# Patient Record
Sex: Female | Born: 1960
Health system: Southern US, Community
[De-identification: ages and names within clinical notes are randomized; demographics above are authoritative.]

## PROBLEM LIST (undated history)

## (undated) DIAGNOSIS — I340 Nonrheumatic mitral (valve) insufficiency: Secondary | ICD-10-CM

## (undated) DIAGNOSIS — O1200 Gestational edema, unspecified trimester: Secondary | ICD-10-CM

## (undated) DIAGNOSIS — J3089 Other allergic rhinitis: Secondary | ICD-10-CM

## (undated) DIAGNOSIS — F410 Panic disorder [episodic paroxysmal anxiety] without agoraphobia: Secondary | ICD-10-CM

## (undated) DIAGNOSIS — O139 Gestational [pregnancy-induced] hypertension without significant proteinuria, unspecified trimester: Secondary | ICD-10-CM

## (undated) DIAGNOSIS — Z801 Family history of malignant neoplasm of trachea, bronchus and lung: Secondary | ICD-10-CM

## (undated) DIAGNOSIS — E785 Hyperlipidemia, unspecified: Secondary | ICD-10-CM

## (undated) DIAGNOSIS — R002 Palpitations: Secondary | ICD-10-CM

## (undated) DIAGNOSIS — J358 Other chronic diseases of tonsils and adenoids: Secondary | ICD-10-CM

## (undated) DIAGNOSIS — E236 Other disorders of pituitary gland: Secondary | ICD-10-CM

## (undated) DIAGNOSIS — J45909 Unspecified asthma, uncomplicated: Secondary | ICD-10-CM

## (undated) DIAGNOSIS — F329 Major depressive disorder, single episode, unspecified: Secondary | ICD-10-CM

## (undated) DIAGNOSIS — I10 Essential (primary) hypertension: Secondary | ICD-10-CM

## (undated) DIAGNOSIS — J309 Allergic rhinitis, unspecified: Secondary | ICD-10-CM

## (undated) DIAGNOSIS — Z8249 Family history of ischemic heart disease and other diseases of the circulatory system: Secondary | ICD-10-CM

## (undated) DIAGNOSIS — Z808 Family history of malignant neoplasm of other organs or systems: Secondary | ICD-10-CM

## (undated) DIAGNOSIS — Z8042 Family history of malignant neoplasm of prostate: Secondary | ICD-10-CM

## (undated) DIAGNOSIS — J45990 Exercise induced bronchospasm: Secondary | ICD-10-CM

## (undated) DIAGNOSIS — Z803 Family history of malignant neoplasm of breast: Secondary | ICD-10-CM

## (undated) DIAGNOSIS — F32A Depression, unspecified: Secondary | ICD-10-CM

## (undated) DIAGNOSIS — F419 Anxiety disorder, unspecified: Secondary | ICD-10-CM

## (undated) DIAGNOSIS — B009 Herpesviral infection, unspecified: Secondary | ICD-10-CM

## (undated) DIAGNOSIS — S83209A Unspecified tear of unspecified meniscus, current injury, unspecified knee, initial encounter: Secondary | ICD-10-CM

## (undated) HISTORY — DX: Unspecified tear of unspecified meniscus, current injury, unspecified knee, initial encounter: S83.209A

## (undated) HISTORY — DX: Palpitations: R00.2

## (undated) HISTORY — DX: Anxiety disorder, unspecified: F41.9

## (undated) HISTORY — DX: Other allergic rhinitis: J30.89

## (undated) HISTORY — DX: Nonrheumatic mitral (valve) insufficiency: I34.0

## (undated) HISTORY — DX: Family history of malignant neoplasm of prostate: Z80.42

## (undated) HISTORY — DX: Family history of malignant neoplasm of trachea, bronchus and lung: Z80.1

## (undated) HISTORY — DX: Panic disorder (episodic paroxysmal anxiety): F41.0

## (undated) HISTORY — DX: Other disorders of pituitary gland: E23.6

## (undated) HISTORY — PX: OTHER SURGICAL HISTORY: SHX169

## (undated) HISTORY — DX: Allergic rhinitis, unspecified: J30.9

## (undated) HISTORY — DX: Depression, unspecified: F32.A

## (undated) HISTORY — DX: Herpesviral infection, unspecified: B00.9

## (undated) HISTORY — DX: Family history of malignant neoplasm of other organs or systems: Z80.8

## (undated) HISTORY — DX: Family history of malignant neoplasm of breast: Z80.3

## (undated) HISTORY — PX: FOOT SURGERY: SHX648

## (undated) HISTORY — DX: Essential (primary) hypertension: I10

## (undated) HISTORY — DX: Hyperlipidemia, unspecified: E78.5

## (undated) HISTORY — DX: Gestational edema, unspecified trimester: O12.00

## (undated) HISTORY — DX: Unspecified asthma, uncomplicated: J45.909

## (undated) HISTORY — DX: Other chronic diseases of tonsils and adenoids: J35.8

## (undated) HISTORY — DX: Exercise induced bronchospasm: J45.990

## (undated) HISTORY — DX: Family history of ischemic heart disease and other diseases of the circulatory system: Z82.49

## (undated) HISTORY — DX: Gestational (pregnancy-induced) hypertension without significant proteinuria, unspecified trimester: O13.9

## (undated) HISTORY — DX: Major depressive disorder, single episode, unspecified: F32.9

---

## 1998-01-28 ENCOUNTER — Other Ambulatory Visit: Admission: RE | Admit: 1998-01-28 | Discharge: 1998-01-28 | Payer: Self-pay | Admitting: Obstetrics and Gynecology

## 1998-08-01 ENCOUNTER — Ambulatory Visit (HOSPITAL_COMMUNITY): Admission: RE | Admit: 1998-08-01 | Discharge: 1998-08-01 | Payer: Self-pay | Admitting: Gynecology

## 1999-03-08 ENCOUNTER — Other Ambulatory Visit: Admission: RE | Admit: 1999-03-08 | Discharge: 1999-03-08 | Payer: Self-pay | Admitting: Obstetrics and Gynecology

## 2000-02-01 ENCOUNTER — Other Ambulatory Visit: Admission: RE | Admit: 2000-02-01 | Discharge: 2000-02-01 | Payer: Self-pay | Admitting: Obstetrics and Gynecology

## 2000-08-21 ENCOUNTER — Other Ambulatory Visit: Admission: RE | Admit: 2000-08-21 | Discharge: 2000-08-21 | Payer: Self-pay | Admitting: Obstetrics and Gynecology

## 2000-08-21 ENCOUNTER — Encounter (INDEPENDENT_AMBULATORY_CARE_PROVIDER_SITE_OTHER): Payer: Self-pay

## 2000-12-07 ENCOUNTER — Inpatient Hospital Stay (HOSPITAL_COMMUNITY): Admission: RE | Admit: 2000-12-07 | Discharge: 2000-12-07 | Payer: Self-pay | Admitting: Obstetrics and Gynecology

## 2001-01-12 ENCOUNTER — Inpatient Hospital Stay: Admission: AD | Admit: 2001-01-12 | Discharge: 2001-01-12 | Payer: Self-pay | Admitting: Obstetrics and Gynecology

## 2001-03-05 ENCOUNTER — Other Ambulatory Visit: Admission: RE | Admit: 2001-03-05 | Discharge: 2001-03-05 | Payer: Self-pay | Admitting: Obstetrics and Gynecology

## 2002-04-27 ENCOUNTER — Other Ambulatory Visit: Admission: RE | Admit: 2002-04-27 | Discharge: 2002-04-27 | Payer: Self-pay | Admitting: Obstetrics and Gynecology

## 2003-05-03 ENCOUNTER — Other Ambulatory Visit: Admission: RE | Admit: 2003-05-03 | Discharge: 2003-05-03 | Payer: Self-pay | Admitting: Obstetrics and Gynecology

## 2004-05-03 ENCOUNTER — Other Ambulatory Visit: Admission: RE | Admit: 2004-05-03 | Discharge: 2004-05-03 | Payer: Self-pay | Admitting: Obstetrics and Gynecology

## 2005-05-11 ENCOUNTER — Other Ambulatory Visit: Admission: RE | Admit: 2005-05-11 | Discharge: 2005-05-11 | Payer: Self-pay | Admitting: Obstetrics and Gynecology

## 2005-11-05 HISTORY — PX: ABLATION: SHX5711

## 2006-05-20 ENCOUNTER — Other Ambulatory Visit: Admission: RE | Admit: 2006-05-20 | Discharge: 2006-05-20 | Payer: Self-pay | Admitting: Obstetrics & Gynecology

## 2006-06-06 ENCOUNTER — Encounter: Admission: RE | Admit: 2006-06-06 | Discharge: 2006-06-06 | Payer: Self-pay | Admitting: Gastroenterology

## 2006-07-16 ENCOUNTER — Ambulatory Visit (HOSPITAL_BASED_OUTPATIENT_CLINIC_OR_DEPARTMENT_OTHER): Admission: RE | Admit: 2006-07-16 | Discharge: 2006-07-16 | Payer: Self-pay | Admitting: Obstetrics and Gynecology

## 2006-09-06 ENCOUNTER — Ambulatory Visit: Payer: Self-pay | Admitting: Sports Medicine

## 2006-12-10 ENCOUNTER — Ambulatory Visit: Payer: Self-pay | Admitting: Sports Medicine

## 2007-05-30 ENCOUNTER — Other Ambulatory Visit: Admission: RE | Admit: 2007-05-30 | Discharge: 2007-05-30 | Payer: Self-pay | Admitting: Obstetrics and Gynecology

## 2008-07-09 ENCOUNTER — Other Ambulatory Visit: Admission: RE | Admit: 2008-07-09 | Discharge: 2008-07-09 | Payer: Self-pay | Admitting: Obstetrics and Gynecology

## 2009-08-01 ENCOUNTER — Ambulatory Visit: Payer: Self-pay | Admitting: Sports Medicine

## 2009-08-01 DIAGNOSIS — R269 Unspecified abnormalities of gait and mobility: Secondary | ICD-10-CM | POA: Insufficient documentation

## 2009-08-01 DIAGNOSIS — M7750 Other enthesopathy of unspecified foot: Secondary | ICD-10-CM | POA: Insufficient documentation

## 2010-12-07 NOTE — Assessment & Plan Note (Signed)
Summary: orthotics/kh   Vital Signs:  Patient profile:   50 year old female Height:      67 inches Weight:      200 pounds BMI:     31.44 BP sitting:   124 / 70  Vitals Entered By: Lillia Pauls CMA (August 01, 2009 11:18 AM)  History of Present Illness: 66F presents for a new set of orthotics.  Had a set previously, had some MT padding in the past which was not present on the current set, also has some 1st MT posting.  Has tried exerciuse classes and walking but has persistent forefoot pain if not using orthotics  wanting to exercise for weight loss  Physical Exam  General:  Well-developed,well-nourished,in no acute distress; alert,appropriate and cooperative throughout examination Msk:  Ankle: No visible erythema or swelling. Range of motion is full in all directions. Strength is 5/5 in all directions. Stable lateral and medial ligaments; squeeze test and kleiger test unremarkable; Talar dome nontender; No pain at base of 5th MT; No tenderness over cuboid; No tenderness over N spot or navicular prominence No tenderness on posterior aspects of lateral and medial malleolus No sign of peroneal tendon subluxations; Negative tarsal tunnel tinel's mild collapse of transverse arch. bilaterally She does pronate significantly with gait.  Additional Exam:  Patient was fitted for a : standard, cushioned, semi-rigid orthotic. The orthotic was heated and afterward the patient stood on the orthotic blank positioned on the orthotic stand. The patient was positioned in subtalar neutral position and 10 degrees of ankle dorsiflexion in a weight bearing stance. After completion of molding, a stable base was applied to the orthotic blank. The blank was ground to a stable position for weight bearing. Size: Base:black dense foam Posting: none Additional orthotic padding: small MT pad on left, MT cookie on right.  Posting not repeated.  Pt with significant improvement in symptoms with new  orthotic.    Impression & Recommendations:  Problem # 1:  ABNORMALITY OF GAIT (ICD-781.2) Assessment Improved  Semi-rigid orthotics made today, gait improved.  Orders: [employer], each unit [phone])  Problem # 2:  METATARSALGIA (ICD-726.70) Assessment: Improved  MT pad small on left, MT cookie on right.  Orders: [employer], each unit [phone])

## 2011-03-23 NOTE — Op Note (Signed)
Vickie Perry, Vickie Perry           ACCOUNT NO.:  [id number]   MEDICAL RECORD NO.:  [id number]          PATIENT TYPE:  AMB   LOCATION:  [facility]                         FACILITY:  [facility]   PHYSICIAN:  Cynthia P. Romine, M.D.DATE OF BIRTH:  1961-03-23   DATE OF PROCEDURE:  07/16/2006  DATE OF DISCHARGE:                                 OPERATIVE REPORT   PREOPERATIVE DIAGNOSIS:  Menorrhagia.   POSTOPERATIVE DIAGNOSIS:  Menorrhagia.   PROCEDURE:  Endometrial ablation using the HydroThermAblator technique.   SURGEON:  Dr. Arline Asp Romine.   ANESTHESIA:  General by LMA.   ESTIMATED BLOOD LOSS:  Minimal.   COMPLICATIONS:  None.   PROCEDURE:  The patient was taken to the operating room and, after induction  of adequate general anesthesia by LMA, was placed in dorsal lithotomy  position.  The bladder was drained with a red rubber catheter, and she was  prepped and draped in usual fashion.  A posterior weighted and anterior Sims  retractor were placed.  The cervix was grasped on its anterior lip with a  single-tooth tenaculum.  The cervix sounded to 8 cm.  The cervix was dilated  to a #23 Shawnie Pons.  The hysteroscope was introduced.  Proper placement in the  endometrial cavity was documented by noting the tubal ostia.  Photographic  documentation was taken.  The scope was withdrawn to just inside the  internal os and endometrial ablation was carried out according to  manufacturer's direction.  During the warm-up phase of the procedure, the  fluid level to drop 10 mL.  The uterus was inspected and no evidence of  perforation could be found.  There was no leak from the cervix that could be  found, and the procedure continued.  During the ablation, the fluid level  dropped again, not quite 10 mL so that the machine did not automatically cut  off, but during the phase of treatment, it was noted that there was a loose  connection in the tubing in a small trickle of fluid was trickling down the  tubing.   This was felt to explain the drop in the fluid level and no fluid  loss in this patient was noted.  The uterus was intact.  The fit of the  cervix around the hysteroscope was tight.  No fluid was noted in the vagina.  Upon completion of 10 minutes of treatment, cool fluid was run through the  uterus.  Photographic documentation was taken of the uterus after the  ablation.  When the fluid had completed its cooling, the scope was  withdrawn.  Photographic documentation was taken of the internal os.  Scope was  withdrawn.  The instruments were removed from the vagina and the procedure  was terminated.  The patient tolerated it well and went in satisfactory  condition to post anesthesia recovery.           ______________________________  Edwena Felty. Romine, M.D.     CPR/MEDQ  D:  07/16/2006  T:  07/16/2006  Job:  [id number]   cc:   Aram Beecham P. Romine, M.D.  Fax: [phone]

## 2011-10-21 ENCOUNTER — Ambulatory Visit (INDEPENDENT_AMBULATORY_CARE_PROVIDER_SITE_OTHER): Payer: 59

## 2011-10-21 DIAGNOSIS — J029 Acute pharyngitis, unspecified: Secondary | ICD-10-CM

## 2011-10-21 DIAGNOSIS — IMO0001 Reserved for inherently not codable concepts without codable children: Secondary | ICD-10-CM

## 2011-10-21 DIAGNOSIS — J111 Influenza due to unidentified influenza virus with other respiratory manifestations: Secondary | ICD-10-CM

## 2012-05-05 DIAGNOSIS — J358 Other chronic diseases of tonsils and adenoids: Secondary | ICD-10-CM

## 2012-05-05 HISTORY — DX: Other chronic diseases of tonsils and adenoids: J35.8

## 2012-07-04 ENCOUNTER — Encounter: Payer: Self-pay | Admitting: Sports Medicine

## 2012-07-29 ENCOUNTER — Ambulatory Visit (INDEPENDENT_AMBULATORY_CARE_PROVIDER_SITE_OTHER): Payer: 59 | Admitting: Sports Medicine

## 2012-07-29 VITALS — BP 110/70 | Ht 67.0 in | Wt 185.0 lb

## 2012-07-29 DIAGNOSIS — R269 Unspecified abnormalities of gait and mobility: Secondary | ICD-10-CM

## 2012-07-29 DIAGNOSIS — M7750 Other enthesopathy of unspecified foot: Secondary | ICD-10-CM

## 2012-07-29 NOTE — Assessment & Plan Note (Signed)
Without orthotics has excess supination and over pronates on compensation giving a lot of ankle "wobble"  Patient was fitted for a : standard, cushioned, semi-rigid orthotic. The orthotic was heated and afterward the patient stood on the orthotic blank positioned on the orthotic stand. The patient was positioned in subtalar neutral position and 10 degrees of ankle dorsiflexion in a weight bearing stance. After completion of molding, a stable base was applied to the orthotic blank. The blank was ground to a stable position for weight bearing. Size: 8 red EVA Base: blue EVA Posting: MT pads Additional orthotic padding: O  Two pairs created for running shoes at beach and home Uses sports insoles in some walking shoes  Gait is much improved and comfortable in these  Time 50 mins

## 2012-07-29 NOTE — Assessment & Plan Note (Signed)
This is clearly helped in past by MT pads  Now pads are not in correct location and old orthotics are broken down p 3+ yrs  New MT measurements taken  New MT pads added to orthotics and other shoes

## 2012-07-29 NOTE — Progress Notes (Signed)
  Subjective:    Patient ID: Vickie Perry, female    DOB: [birth year], 51 y.o.   MRN: [id number]  HPI  Pt presents to clinic for new orthotics. She has had her current pair since 2010. She is jogging now.  Orthotics have corrected her gait issues Much less forefoot pain now Able to run several times per week Distal 2nd toe is painful   Review of Systems     Objective:   Physical Exam Pleasant, NAD Overweight  Rt 2nd toe pain- tip Morton's foot 2nd toe 1 cm longer than 1st, 4th toe 1 cm longer than 3rd  Cavus foot bilat with slight loss long arch 1st MTP hypertrophy bilat No calcaneal valgus Good post tib function Flattening of transverse arch R>L  Mild morton's callus on rt, and under 1st toe   Surgical scar rt 1st MTP      Assessment & Plan:

## 2013-05-20 ENCOUNTER — Telehealth: Payer: Self-pay | Admitting: Nurse Practitioner

## 2013-05-20 NOTE — Telephone Encounter (Signed)
Patient calling to state has had no cycle since 01/04/2013. Patient states has notice a  Change in her mood behavior. Patient states she has not had a cycle for April, June or July. Please advise.' Last AEX 12/30/2012 with Shirlyn Goltz. In paper chart. Chart in your office.  Please advise.

## 2013-05-20 NOTE — Telephone Encounter (Signed)
No menses since 01/04/13, patient thinks she may need to try another provera challenge.

## 2013-05-21 ENCOUNTER — Other Ambulatory Visit: Payer: Self-pay | Admitting: Orthopedic Surgery

## 2013-05-21 MED ORDER — MEDROXYPROGESTERONE ACETATE 10 MG PO TABS: 10.0000 mg | ORAL_TABLET | Freq: Every day | ORAL | Status: DC

## 2013-05-21 NOTE — Telephone Encounter (Signed)
Spoke with pt about PG advice to try another Provera challenge for 10 days. Pt agreeable. Uses [facility] on [address]. Pt to call back for update.

## 2013-05-21 NOTE — Telephone Encounter (Signed)
Since no menses since 3/214;  I would recommend a Provera challenge 10 mg for 10 days then expect a withdrawal bleed in the 2 wk's following.  Her last /FSH was 4.7 on 12/30/12.  Have her to call back with response to med's or if prolonged heavy bleeding

## 2013-07-20 ENCOUNTER — Telehealth: Payer: Self-pay | Admitting: Nurse Practitioner

## 2013-07-20 NOTE — Telephone Encounter (Signed)
Patient was told to call if she has not started her cycle. 

## 2013-07-20 NOTE — Telephone Encounter (Signed)
Spoke with pt who took Provera July 19th through 28th. Pt had no bleeding or spotting. Was told to call. Please advise.

## 2013-07-21 NOTE — Telephone Encounter (Signed)
Will have patient to keep menses record and in 3 months if no menses again to call back per Dr. Hyacinth Meeker

## 2013-07-23 NOTE — Telephone Encounter (Signed)
Spoke with patient and message from [place] given. Will f/u prn.

## 2013-07-28 ENCOUNTER — Other Ambulatory Visit: Payer: Self-pay | Admitting: Nurse Practitioner

## 2013-07-28 DIAGNOSIS — A609 Anogenital herpesviral infection, unspecified: Secondary | ICD-10-CM

## 2013-07-28 MED ORDER — VALACYCLOVIR HCL 500 MG PO TABS: 500.0000 mg | ORAL_TABLET | Freq: Two times a day (BID) | ORAL | Status: DC

## 2013-07-28 NOTE — Telephone Encounter (Signed)
Order is placed in Epic it was not listed as routine med at last visit. # 30 given with 5 refills

## 2013-07-28 NOTE — Telephone Encounter (Signed)
Please advise AEX scheduled for 01/28/14, patient's last AEX was 12/30/12 no rx was given   (Chart in your door)

## 2013-07-28 NOTE — Telephone Encounter (Signed)
Valtrex #30/5 rf's sent to [facility].

## 2013-09-10 ENCOUNTER — Encounter: Payer: Self-pay | Admitting: Cardiology

## 2013-10-26 ENCOUNTER — Encounter: Payer: Self-pay | Admitting: Nurse Practitioner

## 2013-11-12 ENCOUNTER — Encounter: Payer: Self-pay | Admitting: General Surgery

## 2013-11-12 DIAGNOSIS — E669 Obesity, unspecified: Secondary | ICD-10-CM | POA: Insufficient documentation

## 2013-11-12 DIAGNOSIS — I10 Essential (primary) hypertension: Secondary | ICD-10-CM | POA: Insufficient documentation

## 2013-11-12 DIAGNOSIS — I119 Hypertensive heart disease without heart failure: Secondary | ICD-10-CM

## 2013-11-12 DIAGNOSIS — Q670 Congenital facial asymmetry: Secondary | ICD-10-CM

## 2013-11-12 DIAGNOSIS — R002 Palpitations: Secondary | ICD-10-CM | POA: Insufficient documentation

## 2013-11-12 DIAGNOSIS — I340 Nonrheumatic mitral (valve) insufficiency: Secondary | ICD-10-CM | POA: Insufficient documentation

## 2013-11-26 ENCOUNTER — Ambulatory Visit: Payer: 59 | Admitting: Cardiology

## 2013-12-03 ENCOUNTER — Ambulatory Visit (INDEPENDENT_AMBULATORY_CARE_PROVIDER_SITE_OTHER): Payer: 59 | Admitting: Cardiology

## 2013-12-03 ENCOUNTER — Encounter: Payer: Self-pay | Admitting: General Surgery

## 2013-12-03 ENCOUNTER — Encounter: Payer: Self-pay | Admitting: Cardiology

## 2013-12-03 VITALS — BP 110/80 | HR 72 | Ht 67.0 in | Wt 198.4 lb

## 2013-12-03 DIAGNOSIS — I059 Rheumatic mitral valve disease, unspecified: Secondary | ICD-10-CM

## 2013-12-03 DIAGNOSIS — I340 Nonrheumatic mitral (valve) insufficiency: Secondary | ICD-10-CM

## 2013-12-03 DIAGNOSIS — R002 Palpitations: Secondary | ICD-10-CM

## 2013-12-03 DIAGNOSIS — I10 Essential (primary) hypertension: Secondary | ICD-10-CM

## 2013-12-03 NOTE — Progress Notes (Signed)
[place], [address] [place], [place]  [address] Phone: [phone] Fax:  [phone]  Date:  12/03/2013   ID:  [name], DOB December 30, 1960, MRN [id number]  PCP:  Henrine Screws, MD  Cardiologist:  Fransico Him, MD     History of Present Illness: Vickie Perry is a 53 y.o. female with a history of mild MR/AR/PR, HTN and history of palpitations with no significant arrhythmias on heart monitor who presents today.  She is doing well.  She denies any chest pain, SOB, DOE, LE edema, dizziness or syncope.  She recently went on Phentiramine for weight loss.  SHe rarely has any palpitations and has only had to take PRN metoprolol a few times.   Wt Readings from Last 3 Encounters:  12/03/13 198 lb 6.4 oz (89.994 kg)  07/29/12 185 lb (83.915 kg)  08/01/09 200 lb (90.719 kg)     Past Medical History  Diagnosis Date  . Hypertension   . Mitral regurgitation   . Palpitations   . Dyslipidemia   . Anxiety   . Allergic rhinitis   . Asthma   . Perennial allergic rhinitis   . Exercise induced bronchospasm   . Tonsillar calculus 05/2012    Dr Orpah Greek bates    Current Outpatient Prescriptions  Medication Sig Dispense Refill  . albuterol (PROVENTIL HFA;VENTOLIN HFA) 108 (90 BASE) MCG/ACT inhaler Inhale 2 puffs into the lungs every 6 (six) hours as needed for wheezing or shortness of breath.      . ALPRAZolam (XANAX) 0.25 MG tablet Take 0.25 mg by mouth at bedtime as needed for anxiety (Used rarely).      Marland Kitchen aspirin 81 MG tablet Take 162 mg by mouth daily.       . clonazePAM (KLONOPIN) 1 MG tablet Take 1 mg by mouth daily.      Marland Kitchen escitalopram (LEXAPRO) 10 MG tablet Take 10 mg by mouth daily.      Marland Kitchen levocetirizine (XYZAL) 5 MG tablet Take 5 mg by mouth as needed.       . metoprolol succinate (TOPROL-XL) 25 MG 24 hr tablet Take 25 mg by mouth as needed (for palpitations).      . pantoprazole (PROTONIX) 40 MG tablet Take 40 mg by mouth as needed.      . phentermine  37.5 MG capsule Take 37.5 mg by mouth every morning.      . rosuvastatin (CRESTOR) 10 MG tablet Take 5 mg by mouth daily.      Marland Kitchen topiramate (TOPAMAX) 25 MG tablet Take 25 mg by mouth daily.       Marland Kitchen triamcinolone cream (KENALOG) 0.1 % Apply 1 application topically as needed.       . valACYclovir (VALTREX) 1000 MG tablet TAKE 1 TABLET BY MOUTH as needed      . valsartan (DIOVAN) 160 MG tablet Take 80 mg by mouth daily.      . Vitamin D, Ergocalciferol, (DRISDOL) 50000 UNITS CAPS capsule Take 50,000 Units by mouth every 7 (seven) days.       No current facility-administered medications for this visit.    Allergies:    Allergies  Allergen Reactions  . Erythromycin     Social History:  The patient  reports that she has never smoked. She does not have any smokeless tobacco history on file. She reports that she drinks about 1.8 ounces of alcohol per week. She reports that she does not use illicit drugs.   Family History:  The  patient's family history includes Heart disease in her brother and father; Prostate cancer in her father.   ROS:  Please see the history of present illness.      All other systems reviewed and negative.   PHYSICAL EXAM: VS:  BP 110/80  Pulse 72  Ht 5\' 7"  (1.702 m)  Wt 198 lb 6.4 oz (89.994 kg)  BMI 31.07 kg/m2 Well nourished, well developed, in no acute distress HEENT: normal Neck: no JVD Cardiac:  normal S1, S2; RRR; no murmur Lungs:  clear to auscultation bilaterally, no wheezing, rhonchi or rales Abd: soft, nontender, no hepatomegaly Ext: no edema Skin: warm and dry Neuro:  CNs 2-12 intact, no focal abnormalities noted  EKG:  NSR with no ST changes     ASSESSMENT AND PLAN:  1.  HTN well controlled  - continue Valsartan 2.  Palpitations - theses have significantly decreased in frequency and she has only had to take PRN metoprolol a few times.  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  Mild MR/AR/TR - she is currently taking  phentiramine - she has not had an echo in 5 years   - check 2D echo to make sure she has no valvular effects from the times she has taken phentiramine in the past.     Followup with me in 1 year  Signed, Fransico Him, MD 12/03/2013 10:13 AM

## 2013-12-03 NOTE — Patient Instructions (Signed)
Your physician recommends that you continue on your current medications as directed. Please refer to the Current Medication list given to you today.  Your physician has requested that you have an echocardiogram. Echocardiography is a painless test that uses sound waves to create images of your heart. It provides your doctor with information about the size and shape of your heart and how well your heart's chambers and valves are working. This procedure takes approximately one hour. There are no restrictions for this procedure.  Your physician wants you to follow-up in: 1 year with Dr. Turner.  You will receive a reminder letter in the mail two months in advance. If you don't receive a letter, please call our office to schedule the follow-up appointment.  

## 2013-12-24 ENCOUNTER — Ambulatory Visit (HOSPITAL_COMMUNITY): Payer: 59 | Attending: Internal Medicine | Admitting: Radiology

## 2013-12-24 ENCOUNTER — Other Ambulatory Visit: Payer: Self-pay

## 2013-12-24 ENCOUNTER — Encounter: Payer: Self-pay | Admitting: Internal Medicine

## 2013-12-24 DIAGNOSIS — E785 Hyperlipidemia, unspecified: Secondary | ICD-10-CM | POA: Insufficient documentation

## 2013-12-24 DIAGNOSIS — R002 Palpitations: Secondary | ICD-10-CM

## 2013-12-24 DIAGNOSIS — I340 Nonrheumatic mitral (valve) insufficiency: Secondary | ICD-10-CM

## 2013-12-24 DIAGNOSIS — E669 Obesity, unspecified: Secondary | ICD-10-CM | POA: Insufficient documentation

## 2013-12-24 DIAGNOSIS — I059 Rheumatic mitral valve disease, unspecified: Secondary | ICD-10-CM

## 2013-12-24 DIAGNOSIS — I10 Essential (primary) hypertension: Secondary | ICD-10-CM | POA: Insufficient documentation

## 2013-12-24 NOTE — Progress Notes (Signed)
Echocardiogram performed.  

## 2014-01-28 ENCOUNTER — Ambulatory Visit: Payer: 59 | Admitting: Nurse Practitioner

## 2014-02-02 ENCOUNTER — Ambulatory Visit: Payer: 59 | Admitting: Nurse Practitioner

## 2014-02-25 ENCOUNTER — Ambulatory Visit: Payer: 59 | Admitting: Nurse Practitioner

## 2014-03-02 ENCOUNTER — Encounter: Payer: Self-pay | Admitting: Nurse Practitioner

## 2014-03-02 ENCOUNTER — Ambulatory Visit (INDEPENDENT_AMBULATORY_CARE_PROVIDER_SITE_OTHER): Payer: 59 | Admitting: Nurse Practitioner

## 2014-03-02 VITALS — BP 100/66 | HR 68 | Resp 16 | Ht 66.5 in

## 2014-03-02 DIAGNOSIS — N912 Amenorrhea, unspecified: Secondary | ICD-10-CM

## 2014-03-02 DIAGNOSIS — Z0000 Encounter for general adult medical examination without abnormal findings: Secondary | ICD-10-CM

## 2014-03-02 DIAGNOSIS — N949 Unspecified condition associated with female genital organs and menstrual cycle: Secondary | ICD-10-CM

## 2014-03-02 DIAGNOSIS — Z01419 Encounter for gynecological examination (general) (routine) without abnormal findings: Secondary | ICD-10-CM

## 2014-03-02 DIAGNOSIS — E559 Vitamin D deficiency, unspecified: Secondary | ICD-10-CM

## 2014-03-02 LAB — POCT URINALYSIS DIPSTICK
Bilirubin, UA: NEGATIVE
Blood, UA: NEGATIVE
Glucose, UA: NEGATIVE
Ketones, UA: NEGATIVE
Leukocytes, UA: NEGATIVE
Nitrite, UA: NEGATIVE
Protein, UA: NEGATIVE
Urobilinogen, UA: NEGATIVE
pH, UA: 5

## 2014-03-02 LAB — HEMOGLOBIN, FINGERSTICK: Hemoglobin, fingerstick: 12.7 g/dL (ref 12.0–16.0)

## 2014-03-02 MED ORDER — VALACYCLOVIR HCL 1 G PO TABS: ORAL_TABLET | ORAL | Status: DC

## 2014-03-02 NOTE — Progress Notes (Signed)
Patient ID: Vickie Perry, female   DOB: Oct 24, 1961, 53 y.o.   MRN: [id number] 53 y.o. [id number] Married Caucasian Fe here for annual exam.  Occasional vaso symptom.  Last Provera challenge 10/13 no menses, then normal menses 3 2014 with FSH of 4.7.  Then Provera challenge 05/2013 without menses.  Less moody this year and has done OK without hormones.  She does have a pain right inguinal or at the left mons pubic area for 2 months.  Only tine it hurts is at night when laying on her left side and the right leg lays over that area it will be painful.  She denies any trauma, new exercise class or any positional things that would have caused the pain.   Patient's last menstrual period was 01/04/2013.          Sexually active: yes  The current method of family planning is post menopausal status.    Exercising: yes  Home exercise routine includes walking 4 times per week. Smoker:  no  Health Maintenance: Pap:  12/30/12, WNL, neg HR HPV MMG:  07/10/13, 3D, normal Colonoscopy:  01/2014, normal, repeat in 10 years TDaP:  2007 Labs: HB:  12.7 Urine:  Negative    reports that she has never smoked. She does not have any smokeless tobacco history on file. She reports that she drinks about 1.8 ounces of alcohol per week. She reports that she does not use illicit drugs.  Past Medical History  Diagnosis Date  . Hypertension   . Mitral regurgitation   . Palpitations   . Dyslipidemia   . Anxiety   . Allergic rhinitis   . Asthma   . Perennial allergic rhinitis   . Exercise induced bronchospasm   . Tonsillar calculus 05/2012    Dr Orpah Greek bates    Past Surgical History  Procedure Laterality Date  . Cesarean section      x 2  . Foot surgery    . Plastic eye surgery      Current Outpatient Prescriptions  Medication Sig Dispense Refill  . albuterol (PROVENTIL HFA;VENTOLIN HFA) 108 (90 BASE) MCG/ACT inhaler Inhale 2 puffs into the lungs every 6 (six) hours as needed for wheezing or shortness of  breath.      . ALPRAZolam (XANAX) 0.25 MG tablet Take 0.25 mg by mouth at bedtime as needed for anxiety (Used rarely).      Marland Kitchen aspirin 81 MG tablet Take 162 mg by mouth daily.       . clonazePAM (KLONOPIN) 1 MG tablet Take 1 mg by mouth daily.      Marland Kitchen escitalopram (LEXAPRO) 10 MG tablet Take 10 mg by mouth daily.      . metoprolol succinate (TOPROL-XL) 25 MG 24 hr tablet Take 25 mg by mouth as needed (for palpitations).      . pantoprazole (PROTONIX) 40 MG tablet Take 40 mg by mouth as needed.      . phentermine 37.5 MG capsule Take 37.5 mg by mouth every morning.      . rosuvastatin (CRESTOR) 10 MG tablet Take 5 mg by mouth daily.      Marland Kitchen topiramate (TOPAMAX) 25 MG tablet Take 25 mg by mouth daily.       Marland Kitchen triamcinolone cream (KENALOG) 0.1 % Apply 1 application topically as needed.       . valACYclovir (VALTREX) 1000 MG tablet TAKE 1 TABLET BY MOUTH as needed  30 tablet  12  . valsartan (DIOVAN) 160 MG tablet Take  80 mg by mouth daily.      . Vitamin D, Ergocalciferol, (DRISDOL) 50000 UNITS CAPS capsule Take 50,000 Units by mouth every 7 (seven) days.      Marland Kitchen levocetirizine (XYZAL) 5 MG tablet Take 5 mg by mouth as needed.        No current facility-administered medications for this visit.    Family History  Problem Relation Age of Onset  . Prostate cancer Father   . Heart disease Father   . Heart disease Brother   . Heart disease Mother   . Heart disease Maternal Grandfather   . Cancer Paternal Grandfather     ROS:  Pertinent items are noted in HPI.  Otherwise, a comprehensive ROS was negative.  Exam:   BP 100/66  Pulse 68  Resp 16  Ht 5' 6.5" (1.689 m)  LMP 01/04/2013 Height: 5' 6.5" (168.9 cm)  Ht Readings from Last 3 Encounters:  03/02/14 5' 6.5" (1.689 m)  12/03/13 5\' 7"  (1.702 m)  07/29/12 5\' 7"  (1.702 m)    General appearance: alert, cooperative and appears stated age Head: Normocephalic, without obvious abnormality, atraumatic Neck: no adenopathy, supple,  symmetrical, trachea midline and thyroid normal to inspection and palpation Lungs: clear to auscultation bilaterally Breasts: normal appearance, no masses or tenderness Heart: regular rate and rhythm Abdomen: soft, non-tender; no masses,  no organomegaly Extremities: extremities normal, atraumatic, no cyanosis or edema Skin: Skin color, texture, turgor normal. No rashes or lesions Lymph nodes: Cervical, supraclavicular, and axillary nodes normal. No inguinal nodes, no signs of hernia No abnormal inguinal nodes palpated Neurologic: Grossly normal   Pelvic: External genitalia:  no lesions              Urethra:  normal appearing urethra with no masses, tenderness or lesions              Bartholin's and Skene's: normal                 Vagina: normal appearing vagina with normal color and discharge, no lesions              Cervix: anteverted              Pap taken: no Bimanual Exam:  Uterus:  normal size, contour, position, consistency, mobility, non-tender, not able to illicit same pain.              Adnexa: no mass, fullness, tenderness               Rectovaginal: Confirms               Anus:  normal sphincter tone, no lesions  A:  Well Woman with normal exam  Amenorrhea since 01/2013  Pain right inguinal and mons area X 2 months, doubt hernia as cause  History of Vit  D deficiency  Weight gain  - now back on med's from PCP and had 30 lb weight loss.  P:   Reviewed health and wellness pertinent to exam  Pap smear not taken today  Mammogram due 9/15  Discussed area of pain with Dr. Sabra Heck.  We will do PUS and make sure no GYN related problems.  Will also have a chance to look at endo lining to make sure this is thin as well.    Will follow with [place] and Vit D  Refilled Vit D and follow with labs.  Counseled on breast self exam, mammography screening, menopause, adequate intake of calcium and vitamin D, diet  and exercise, Kegel's exercises return annually or prn  An After Visit Summary  was printed and given to the patient.

## 2014-03-02 NOTE — Patient Instructions (Signed)

## 2014-03-03 LAB — VITAMIN D 25 HYDROXY (VIT D DEFICIENCY, FRACTURES): Vit D, 25-Hydroxy: 39 ng/mL (ref 30–89)

## 2014-03-03 LAB — FOLLICLE STIMULATING HORMONE: FSH: 49.1 m[IU]/mL

## 2014-03-03 NOTE — Addendum Note (Signed)
Addended by: Michele Mcalpine on: 03/03/2014 11:36 AM   Modules accepted: Orders

## 2014-03-04 ENCOUNTER — Telehealth: Payer: Self-pay | Admitting: Obstetrics & Gynecology

## 2014-03-04 NOTE — Telephone Encounter (Signed)
Left message for patient to call back. Need to go over benefits and schedule PUS °

## 2014-03-04 NOTE — Telephone Encounter (Signed)
Spoke with patient. Advised of $25 copay quote received from insurance for PUS. Scheduled PUS. Advised patient of 72 hour cancellation policy and $272 cancellation fee. Patient agreeable.  Mailed the In-Office procedure form that includes appointment date and time, patient copay, and cancellation policy

## 2014-03-04 NOTE — Progress Notes (Signed)
Addendum   Encounter reviewed by Dr. Brook Silva.  

## 2014-03-10 ENCOUNTER — Telehealth: Payer: Self-pay | Admitting: *Deleted

## 2014-03-10 NOTE — Telephone Encounter (Signed)
Message copied by Graylon Good on Wed Mar 10, 2014  8:57 AM ------      Message from: GRUBB, PATRICIA R      Created: Wed Mar 10, 2014  8:46 AM       Let patietn know that VIT D is OK at 50, follow protocol.  Her [place] shows early menopause but if any vaginal bleeding to call.  Also with PUS we will be checking her endo lining. ------

## 2014-03-10 NOTE — Telephone Encounter (Signed)
Pt notified in result note.  

## 2014-03-10 NOTE — Telephone Encounter (Signed)
I have attempted to contact this patient by phone with the following results: left message to return my call on answering machine (home/mobile per DPR).  

## 2014-03-24 ENCOUNTER — Encounter: Payer: Self-pay | Admitting: Gynecology

## 2014-03-25 ENCOUNTER — Ambulatory Visit (INDEPENDENT_AMBULATORY_CARE_PROVIDER_SITE_OTHER): Payer: 59

## 2014-03-25 ENCOUNTER — Encounter: Payer: Self-pay | Admitting: Obstetrics and Gynecology

## 2014-03-25 ENCOUNTER — Ambulatory Visit (INDEPENDENT_AMBULATORY_CARE_PROVIDER_SITE_OTHER): Payer: 59 | Admitting: Obstetrics and Gynecology

## 2014-03-25 VITALS — BP 100/64 | HR 80 | Ht 66.5 in

## 2014-03-25 DIAGNOSIS — R1031 Right lower quadrant pain: Secondary | ICD-10-CM

## 2014-03-25 DIAGNOSIS — N949 Unspecified condition associated with female genital organs and menstrual cycle: Secondary | ICD-10-CM

## 2014-03-25 NOTE — Patient Instructions (Signed)
Call if your pain in the groin persists, increases, or worsens!  We may need to have you see a Education officer, environmental.

## 2014-03-25 NOTE — Progress Notes (Signed)
  Subjective  Patient presents for pelvic ultrasound.  Having right groin pain near mons pubis for 3 months.  No hernia felt on exam.   Moved to a new home 2 weeks ago.  Relief if lays on her back.  No NSAID use.   No dysuria.  Some constipation.  Colonoscopy in March 2015 - told to take Senna more frequently.   History of Cesarean Section and endometrial ablation.   Objective  See ultrasound - normal uterus, ovaries, 3 - 4 mm echogenic focus of the cervix, no free fluid.      Assessment  Right groin pain/mons pain.  Suspicious for potential hernia. Adhesive disease also possible. Normal GYN ultrasound.  Plan  I encourage NSAID use, heat, and observation. If pain persists or increases, consider general surgery evaluation to rule out herniorrhaphy.   15 minutes face to face time of which over 50% was spent in counseling.   After visit summary to patient.

## 2014-06-18 ENCOUNTER — Ambulatory Visit: Payer: 59 | Admitting: Certified Nurse Midwife

## 2014-10-12 ENCOUNTER — Encounter: Payer: Self-pay | Admitting: Cardiology

## 2014-11-09 DIAGNOSIS — IMO0001 Reserved for inherently not codable concepts without codable children: Secondary | ICD-10-CM | POA: Insufficient documentation

## 2014-11-24 ENCOUNTER — Telehealth: Payer: Self-pay | Admitting: Nurse Practitioner

## 2014-11-24 DIAGNOSIS — Z719 Counseling, unspecified: Secondary | ICD-10-CM | POA: Insufficient documentation

## 2014-11-24 NOTE — Telephone Encounter (Signed)
Pt says she is in menopause and would an appointment to talk about medication.

## 2014-11-24 NOTE — Telephone Encounter (Signed)
Spoke with patient. Patient states that she has been having increased mood swings, hot flashes, and night sweats. Patient would like to come in to see Milford Cage, FNP for discuss symptoms. Appointment scheduled for 1/26 at 11:15am. Agreeable to date and time.  Routing to provider for final review. Patient agreeable to disposition. Will close encounter

## 2014-11-30 ENCOUNTER — Encounter: Payer: Self-pay | Admitting: Nurse Practitioner

## 2014-11-30 ENCOUNTER — Ambulatory Visit (INDEPENDENT_AMBULATORY_CARE_PROVIDER_SITE_OTHER): Payer: BLUE CROSS/BLUE SHIELD | Admitting: Nurse Practitioner

## 2014-11-30 VITALS — BP 116/56 | Temp 98.1°F | Resp 16 | Ht 66.5 in

## 2014-11-30 DIAGNOSIS — E559 Vitamin D deficiency, unspecified: Secondary | ICD-10-CM

## 2014-11-30 DIAGNOSIS — R300 Dysuria: Secondary | ICD-10-CM

## 2014-11-30 DIAGNOSIS — N912 Amenorrhea, unspecified: Secondary | ICD-10-CM

## 2014-11-30 LAB — CBC WITH DIFFERENTIAL/PLATELET
Basophils Absolute: 0.1 10*3/uL (ref 0.0–0.1)
Basophils Relative: 1 % (ref 0–1)
Eosinophils Absolute: 0.1 10*3/uL (ref 0.0–0.7)
Eosinophils Relative: 1 % (ref 0–5)
HCT: 40.6 % (ref 36.0–46.0)
Hemoglobin: 13.5 g/dL (ref 12.0–15.0)
Lymphocytes Relative: 14 % (ref 12–46)
Lymphs Abs: 1 10*3/uL (ref 0.7–4.0)
MCH: 29 pg (ref 26.0–34.0)
MCHC: 33.3 g/dL (ref 30.0–36.0)
MCV: 87.1 fL (ref 78.0–100.0)
MPV: 10.8 fL (ref 8.6–12.4)
Monocytes Absolute: 0.4 10*3/uL (ref 0.1–1.0)
Monocytes Relative: 6 % (ref 3–12)
Neutro Abs: 5.4 10*3/uL (ref 1.7–7.7)
Neutrophils Relative %: 78 % — ABNORMAL HIGH (ref 43–77)
Platelets: 347 10*3/uL (ref 150–400)
RBC: 4.66 MIL/uL (ref 3.87–5.11)
RDW: 13.8 % (ref 11.5–15.5)
WBC: 6.9 10*3/uL (ref 4.0–10.5)

## 2014-11-30 LAB — POCT URINALYSIS DIPSTICK
Urobilinogen, UA: NEGATIVE
pH, UA: 5

## 2014-11-30 LAB — PROLACTIN: Prolactin: 5.4 ng/mL

## 2014-11-30 LAB — TSH: TSH: 0.82 u[IU]/mL (ref 0.350–4.500)

## 2014-11-30 LAB — FOLLICLE STIMULATING HORMONE: FSH: 44.9 m[IU]/mL

## 2014-11-30 MED ORDER — FLUCONAZOLE 150 MG PO TABS: 150.0000 mg | ORAL_TABLET | Freq: Once | ORAL | Status: DC

## 2014-11-30 MED ORDER — MEDROXYPROGESTERONE ACETATE 2.5 MG PO TABS: 2.5000 mg | ORAL_TABLET | Freq: Every day | ORAL | Status: DC

## 2014-11-30 MED ORDER — ESTRADIOL 0.5 MG PO TABS: 0.5000 mg | ORAL_TABLET | Freq: Every day | ORAL | Status: DC

## 2014-11-30 NOTE — Progress Notes (Signed)
Subjective:     Patient ID: Vickie Perry, female   DOB: 03-18-1961, 54 y.o.   MRN: [id number]  HPI This 54 yo WM Fe G4, P2 presents with some vaginal irritation of itching after completion of Augmentin for URI.  She then had plastic surgery with a breast lift and liposuction 3 weeks ago.  Took Keflex post op.  On Friday felt like she was quite fatigued and did not feel well.  She has no signs of infection at post op sites and will be seeing surgeon later this week.  Slight dysuria today.  She also presents to discuss menopausal symptoms.  She has increase in night sweats.  Having insomnia, mood changes, decrease libido, vaginal dryness and 'brain fog'.  She would like to  Start on HRT.  No breast cancer in family, no blood dyscrasia.  Father's side of family with heart disease.  LMP 03/17/13 01/2013 Heath 4.7 03/02/14 FSH 49.1  Review of Systems  Constitutional: Positive for fatigue. Negative for fever, chills and appetite change.  HENT: Negative.   Respiratory: Negative.   Cardiovascular: Negative.   Gastrointestinal: Negative.  Negative for nausea, vomiting, abdominal pain and diarrhea.  Genitourinary: Positive for dysuria, vaginal discharge and dyspareunia. Negative for urgency, decreased urine volume, vaginal bleeding, vaginal pain and pelvic pain.  Musculoskeletal: Negative.   Skin: Negative.   Neurological: Negative.   Psychiatric/Behavioral: Negative.        Objective:   Physical Exam  Constitutional: She appears well-developed and well-nourished. No distress.  No other physical exam done at this time.  Cardiovascular: Normal rate and normal heart sounds.   Pulmonary/Chest: Effort normal.  Vitals reviewed.      Assessment:     R/O UTI Yeast vaginitis post antibiotics Menopausal symptoms.     Plan:     Will follow with urine C&S and micro - did not start antibiotics at this time Will give her Diflucan for yeast Discussion about WHI study with potentia risk and  benefits of HRT such as CVA, DVT, cancer, etc She is started on Estrace 0.5 mg daily along with Provera 2.5 mg daily for 3-4  months until AEX Will get labs today of Baudette, Prolactin, TSH, VIt D, CBC She will follow with surgeon this week about the surgery If any symptoms with HRT to call back.

## 2014-11-30 NOTE — Patient Instructions (Signed)

## 2014-12-01 LAB — URINALYSIS, MICROSCOPIC ONLY
Bacteria, UA: NONE SEEN
Casts: NONE SEEN
Crystals: NONE SEEN
Squamous Epithelial / LPF: NONE SEEN

## 2014-12-01 LAB — URINE CULTURE: Colony Count: 30000

## 2014-12-01 LAB — VITAMIN D 25 HYDROXY (VIT D DEFICIENCY, FRACTURES): Vit D, 25-Hydroxy: 23 ng/mL — ABNORMAL LOW (ref 30–100)

## 2014-12-02 ENCOUNTER — Other Ambulatory Visit: Payer: Self-pay | Admitting: Nurse Practitioner

## 2014-12-02 NOTE — Progress Notes (Signed)
Reviewed personally.  M. Suzanne Ranata Laughery, MD.  

## 2014-12-06 ENCOUNTER — Other Ambulatory Visit: Payer: Self-pay | Admitting: Nurse Practitioner

## 2014-12-06 NOTE — Telephone Encounter (Signed)
Medication refill request: Vitamin D 50,000 Last AEX:  03/02/14 Next AEX: 03/22/15 Last MMG (if hormonal medication request): 07/20/14 BIRADS1:Neg Refill authorized: Today #12/1R?

## 2015-01-04 ENCOUNTER — Ambulatory Visit (INDEPENDENT_AMBULATORY_CARE_PROVIDER_SITE_OTHER): Payer: BLUE CROSS/BLUE SHIELD | Admitting: Internal Medicine

## 2015-01-04 VITALS — BP 110/68 | HR 83 | Temp 98.9°F | Ht 66.5 in

## 2015-01-04 DIAGNOSIS — J0100 Acute maxillary sinusitis, unspecified: Secondary | ICD-10-CM

## 2015-01-04 MED ORDER — HYDROCODONE-HOMATROPINE 5-1.5 MG/5ML PO SYRP: 5.0000 mL | ORAL_SOLUTION | Freq: Four times a day (QID) | ORAL | Status: DC | PRN

## 2015-01-04 MED ORDER — AMOXICILLIN 500 MG PO CAPS: 1000.0000 mg | ORAL_CAPSULE | Freq: Two times a day (BID) | ORAL | Status: AC

## 2015-01-04 NOTE — Progress Notes (Signed)
This chart was scribed for Tami Lin, MD by Einar Pheasant, ED Scribe. This patient was seen in room 13 and the patient's care was started at 9:09 PM.  Subjective:    Patient ID: Vickie Perry, female    DOB: 1961/03/03, 54 y.o.   MRN: [id number]  Chief Complaint  Patient presents with  . Sinus Problem  . Cough    HPI Vickie Perry is a 54 y.o. female with personal medical h/o asthma and HTN.  Pt presents to the office today complaining of a sinusitis that started approximately 1 week ago. She is complaining of associated congestion, productive cough, HA, and bilateral ear pain. Pt states that the cough is worse at night. She reports taking Mucinex DM, sudafed, and Claritin with no relief of her cough or congestion. Pt also reports using her albuterol inhaler with not relief. She denies any fever, neck pain, sore throat, visual disturbance, CP, SOB, abdominal pain, nausea, emesis, diarrhea, urinary symptoms, back pain, HA, weakness, numbness and rash as associated symptoms.     Patient Active Problem List   Diagnosis Date Noted  . Palpitations 11/12/2013  . Obesity, unspecified 11/12/2013  . Benign essential HTN 11/12/2013  . Mitral regurgitation 11/12/2013  . Facial asymmetry 11/12/2013  . METATARSALGIA 08/01/2009  . ABNORMALITY OF GAIT 08/01/2009   Past Medical History  Diagnosis Date  . Hypertension   . Mitral regurgitation   . Palpitations   . Dyslipidemia   . Anxiety   . Allergic rhinitis   . Asthma   . Perennial allergic rhinitis   . Exercise induced bronchospasm   . Tonsillar calculus 05/2012    Dr Orpah Greek bates  . Panic attack   . HSV-2 (herpes simplex virus 2) infection   . Depression     2005  . Hypophysitis     H/O Autoimmune  . PIH (pregnancy induced hypertension)     P partum// severe   . Edema in pregnancy    Past Surgical History  Procedure Laterality Date  . Foot surgery    . Plastic eye surgery    . Cesarean section        x 2// Due to HSV2  . Ablation  2007    HTA   Allergies  Allergen Reactions  . Erythromycin Anaphylaxis   Prior to Admission medications   Medication Sig Start Date End Date Taking? Authorizing Provider  albuterol (PROVENTIL HFA;VENTOLIN HFA) 108 (90 BASE) MCG/ACT inhaler Inhale 2 puffs into the lungs every 6 (six) hours as needed for wheezing or shortness of breath.   Yes Historical Provider, MD  aspirin 81 MG tablet Take 162 mg by mouth 2 (two) times daily.    Yes Historical Provider, MD  beclomethasone (QVAR) 40 MCG/ACT inhaler Inhale 2 puffs into the lungs 2 (two) times daily.   Yes Historical Provider, MD  clonazePAM (KLONOPIN) 1 MG tablet Take 1 mg by mouth daily.   Yes Historical Provider, MD  escitalopram (LEXAPRO) 10 MG tablet Take 10 mg by mouth daily.   Yes Historical Provider, MD  estradiol (ESTRACE) 0.5 MG tablet Take 1 tablet (0.5 mg total) by mouth daily. 11/30/14  Yes Patricia Rolen-Grubb, FNP  fluconazole (DIFLUCAN) 150 MG tablet Take 1 tablet (150 mg total) by mouth once. Take one tablet.  Repeat in 48 hours if symptoms are not completely resolved. 11/30/14  Yes Patricia Rolen-Grubb, FNP  medroxyPROGESTERone (PROVERA) 2.5 MG tablet Take 1 tablet (2.5 mg total) by mouth daily. 11/30/14  Yes  Milford Cage, FNP  metoprolol succinate (TOPROL-XL) 25 MG 24 hr tablet Take 25 mg by mouth as needed (for palpitations).   Yes Historical Provider, MD  pantoprazole (PROTONIX) 40 MG tablet Take 40 mg by mouth as needed.   Yes Historical Provider, MD  rosuvastatin (CRESTOR) 10 MG tablet Take 5 mg by mouth daily.   Yes Historical Provider, MD  valsartan (DIOVAN) 160 MG tablet Take 80 mg by mouth daily.   Yes Historical Provider, MD  Vitamin D, Ergocalciferol, (DRISDOL) 50000 UNITS CAPS capsule TAKE 1 CAPSULE ONCE A WEEK. 12/06/14  Yes Milford Cage, FNP  ALPRAZolam (XANAX) 0.25 MG tablet Take 0.25 mg by mouth at bedtime as needed for anxiety (Used rarely).    Historical Provider,  MD  levocetirizine (XYZAL) 5 MG tablet Take 5 mg by mouth as needed.  11/09/13   Historical Provider, MD  phentermine 37.5 MG capsule Take 37.5 mg by mouth every morning.    Historical Provider, MD  topiramate (TOPAMAX) 25 MG tablet Take 25 mg by mouth daily.  11/23/13   Historical Provider, MD  triamcinolone cream (KENALOG) 0.1 % Apply 1 application topically as needed.  11/11/13   Historical Provider, MD  valACYclovir (VALTREX) 1000 MG tablet TAKE 1 TABLET BY MOUTH as needed Patient not taking: Reported on 01/04/2015 03/02/14   Milford Cage, FNP   History   Social History  . Marital Status: Married    Spouse Name: N/A  . Number of Children: N/A  . Years of Education: N/A   Occupational History  . Not on file.   Social History Main Topics  . Smoking status: Never Smoker   . Smokeless tobacco: Never Used  . Alcohol Use: 1.8 oz/week    3 Glasses of wine per week     Comment: per week  . Drug Use: No  . Sexual Activity: Yes    Birth Control/ Protection: Other-see comments     Comment: vasectomy   Other Topics Concern  . Not on file   Social History Narrative     Review of Systems  Constitutional: Negative for fever and chills.  HENT: Positive for congestion and ear pain. Negative for rhinorrhea, sinus pressure and sore throat.   Respiratory: Positive for cough. Negative for shortness of breath.   Cardiovascular: Negative for chest pain.  Gastrointestinal: Negative for nausea, vomiting, abdominal pain and diarrhea.  Genitourinary: Negative.   Neurological: Positive for headaches. Negative for weakness and numbness.       Objective:   Physical Exam  Constitutional: She appears well-developed and well-nourished. No distress.  HENT:  Head: Normocephalic and atraumatic.  Right Ear: External ear normal.  Left Ear: External ear normal.  Mouth/Throat: Oropharynx is clear and moist. No oropharyngeal exudate.  Nose with purulent discharge noted bilaterally with swollen  turbinates.   Eyes: Conjunctivae and EOM are normal. Pupils are equal, round, and reactive to light. Right eye exhibits no discharge. Left eye exhibits no discharge.  Neck: Normal range of motion. Neck supple. No thyromegaly present.  Cardiovascular: Normal rate, regular rhythm and normal heart sounds.  Exam reveals no gallop and no friction rub.   No murmur heard. Pulmonary/Chest: Effort normal and breath sounds normal. No respiratory distress. She has no wheezes. She has no rales.  Musculoskeletal: She exhibits no edema.  Lymphadenopathy:    She has no cervical adenopathy.  Neurological: She is alert.  Skin: Skin is warm and dry.  Psychiatric: She has a normal mood and affect. Her behavior is  normal. Thought content normal.  Nursing note and vitals reviewed.  BP 110/68 mmHg  Pulse 83  Temp(Src) 98.9 F (37.2 C) (Oral)  Ht 5' 6.5" (1.689 m)  SpO2 97%  LMP 03/17/2013   Assessment & Plan:  Acute maxillary sinusitis, recurrence not specified  Meds ordered this encounter  Medications  . amoxicillin (AMOXIL) 500 MG capsule    Sig: Take 2 capsules (1,000 mg total) by mouth 2 (two) times daily.    Dispense:  40 capsule    Refill:  0  . HYDROcodone-homatropine (HYCODAN) 5-1.5 MG/5ML syrup    Sig: Take 5 mLs by mouth every 6 (six) hours as needed.    Dispense:  120 mL    Refill:  0      I have completed the patient encounter in its entirety as documented by the scribe, with editing by me where necessary. Lela Gell P. Laney Pastor, M.D.

## 2015-01-05 ENCOUNTER — Ambulatory Visit: Payer: 59 | Admitting: Cardiology

## 2015-01-05 DIAGNOSIS — Z719 Counseling, unspecified: Secondary | ICD-10-CM | POA: Insufficient documentation

## 2015-01-22 ENCOUNTER — Other Ambulatory Visit: Payer: Self-pay | Admitting: Nurse Practitioner

## 2015-01-24 NOTE — Telephone Encounter (Signed)
Medication refill request: Vitamin D 50,000 Last AEX:  03/02/14 PG Next AEX: 03/22/15 PG Last MMG (if hormonal medication request): 07/20/14 BIRADS1:Neg Refill authorized: 12/06/14 #12/0R.  Today please advise

## 2015-02-15 ENCOUNTER — Ambulatory Visit (INDEPENDENT_AMBULATORY_CARE_PROVIDER_SITE_OTHER): Payer: BLUE CROSS/BLUE SHIELD | Admitting: Cardiology

## 2015-02-15 ENCOUNTER — Encounter: Payer: Self-pay | Admitting: Cardiology

## 2015-02-15 VITALS — BP 106/66 | HR 66 | Ht 67.0 in

## 2015-02-15 DIAGNOSIS — I10 Essential (primary) hypertension: Secondary | ICD-10-CM | POA: Diagnosis not present

## 2015-02-15 DIAGNOSIS — I340 Nonrheumatic mitral (valve) insufficiency: Secondary | ICD-10-CM

## 2015-02-15 DIAGNOSIS — R002 Palpitations: Secondary | ICD-10-CM | POA: Diagnosis not present

## 2015-02-15 NOTE — Patient Instructions (Signed)

## 2015-02-15 NOTE — Progress Notes (Signed)
Cardiology Office Note   Date:  02/15/2015   ID:  Vickie Perry, DOB 1961/10/19, MRN [id number]  PCP:  Henrine Screws, MD    No chief complaint on file.     History of Present Illness: Vickie Perry is a 54 y.o. female with a history of mild MR/AR/PR, HTN and history of palpitations with no significant arrhythmias on heart monitor who presents today. She is doing well. She denies any chest pain, SOB, DOE, LE edema, dizziness or syncope.  She rarely has any palpitations and has only had to take PRN metoprolol a few times.  She walks 2.5 miles daily and sometimes more.      Past Medical History  Diagnosis Date  . Hypertension   . Mitral regurgitation   . Palpitations   . Dyslipidemia   . Anxiety   . Allergic rhinitis   . Asthma   . Perennial allergic rhinitis   . Exercise induced bronchospasm   . Tonsillar calculus 05/2012    Dr Orpah Greek bates  . Panic attack   . HSV-2 (herpes simplex virus 2) infection   . Depression     2005  . Hypophysitis     H/O Autoimmune  . PIH (pregnancy induced hypertension)     P partum// severe   . Edema in pregnancy     Past Surgical History  Procedure Laterality Date  . Foot surgery    . Plastic eye surgery    . Cesarean section      x 2// Due to HSV2  . Ablation  2007    HTA     Current Outpatient Prescriptions  Medication Sig Dispense Refill  . ACANYA gel Apply 1 application topically as needed. fpr acne  6  . albuterol (PROVENTIL HFA;VENTOLIN HFA) 108 (90 BASE) MCG/ACT inhaler     . aspirin 81 MG tablet Take 81 mg by mouth daily.    . clonazePAM (KLONOPIN) 1 MG tablet Take 1 mg by mouth daily.    Marland Kitchen escitalopram (LEXAPRO) 10 MG tablet Take 10 mg by mouth daily.    Marland Kitchen levocetirizine (XYZAL) 5 MG tablet Take 5 mg by mouth as needed.     . metoprolol succinate (TOPROL-XL) 25 MG 24 hr tablet Take 25 mg by mouth as needed (for palpitations).    . phentermine 37.5 MG capsule Take 37.5 mg by mouth every  morning.    Marland Kitchen QVAR 80 MCG/ACT inhaler Inhale 2 puffs into the lungs 2 (two) times daily.  5  . rosuvastatin (CRESTOR) 10 MG tablet Take 5 mg by mouth daily.    Marland Kitchen topiramate (TOPAMAX) 25 MG tablet Take 25 mg by mouth daily.     Marland Kitchen triamcinolone cream (KENALOG) 0.1 % Apply 1 application topically as needed.     . valACYclovir (VALTREX) 1000 MG tablet TAKE 1 TABLET BY MOUTH as needed 30 tablet 12  . valsartan (DIOVAN) 160 MG tablet Take 80 mg by mouth daily.    . Vitamin D, Ergocalciferol, (DRISDOL) 50000 UNITS CAPS capsule TAKE 1 CAPSULE BY MOUTH ONCE A WEEK. 30 capsule 0  . estradiol (ESTRACE) 0.5 MG tablet Take 1 tablet (0.5 mg total) by mouth daily. (Patient not taking: Reported on 02/15/2015) 90 tablet 1  . medroxyPROGESTERone (PROVERA) 2.5 MG tablet Take 1 tablet (2.5 mg total) by mouth daily. (Patient not taking: Reported on 02/15/2015) 90 tablet 1   No current facility-administered medications for this visit.    Allergies:   Erythromycin  Social History:  The patient  reports that she has never smoked. She has never used smokeless tobacco. She reports that she drinks about 1.8 oz of alcohol per week. She reports that she does not use illicit drugs.   Family History:  The patient's family history includes Cancer in her paternal grandfather; Heart disease in her brother, father, maternal grandfather, and mother; Prostate cancer in her father.    ROS:  Please see the history of present illness.   Otherwise, review of systems are positive for none.   All other systems are reviewed and negative.    PHYSICAL EXAM: VS:  BP 106/66 mmHg  Pulse 66  Ht 5\' 7"  (1.702 m)  Wt   LMP 03/17/2013 , BMI Body mass index is 0.00 kg/(m^2). GEN: Well nourished, well developed, in no acute distress HEENT: normal Neck: no JVD, carotid bruits, or masses Cardiac: RRR; no murmurs, rubs, or gallops,no edema  Respiratory:  clear to auscultation bilaterally, normal work of breathing GI: soft, nontender,  nondistended, + BS MS: no deformity or atrophy Skin: warm and dry, no rash Neuro:  Strength and sensation are intact Psych: euthymic mood, full affect   EKG:  EKG is ordered today. The ekg ordered today demonstrates ectopic atrial rhythm with low voltage QRS and no ST changes   Recent Labs: 11/30/2014: Hemoglobin 13.5; Platelets 347; TSH 0.820    Lipid Panel No results found for: CHOL, TRIG, HDL, CHOLHDL, VLDL, LDLCALC, LDLDIRECT    Wt Readings from Last 3 Encounters:  12/03/13 198 lb 6.4 oz (89.994 kg)  07/29/12 185 lb (83.915 kg)  08/01/09 200 lb (90.719 kg)     ASSESSMENT AND PLAN:  1. HTN well controlled - continue Valsartan 2. Palpitations - theses have significantly decreased in frequency and she has only had to take PRN metoprolol a few times. 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 Mild MR/AR/TR - she is currently taking phentiramine - she has not had an echo in 5 years  Current medicines are reviewed at length with the patient today.  The patient does not have concerns regarding medicines.  The following changes have been made:  no change  Labs/ tests ordered today include: see above assessment and plan  Orders Placed This Encounter  Procedures  . EKG 12-Lead     Disposition:   FU with me in 1 year   Signed, Sueanne Margarita, MD  02/15/2015 4:50 PM    [facility] [address], [place], [place]  [address] Phone: [phone]; Fax: [phone]

## 2015-03-22 ENCOUNTER — Encounter: Payer: Self-pay | Admitting: Nurse Practitioner

## 2015-03-22 ENCOUNTER — Ambulatory Visit (INDEPENDENT_AMBULATORY_CARE_PROVIDER_SITE_OTHER): Payer: BLUE CROSS/BLUE SHIELD | Admitting: Nurse Practitioner

## 2015-03-22 VITALS — BP 110/66 | HR 68 | Resp 16 | Ht 60.75 in

## 2015-03-22 DIAGNOSIS — Z0000 Encounter for general adult medical examination without abnormal findings: Secondary | ICD-10-CM

## 2015-03-22 DIAGNOSIS — Z01419 Encounter for gynecological examination (general) (routine) without abnormal findings: Secondary | ICD-10-CM | POA: Diagnosis not present

## 2015-03-22 DIAGNOSIS — N951 Menopausal and female climacteric states: Secondary | ICD-10-CM | POA: Diagnosis not present

## 2015-03-22 DIAGNOSIS — E559 Vitamin D deficiency, unspecified: Secondary | ICD-10-CM

## 2015-03-22 LAB — POCT URINALYSIS DIPSTICK
Bilirubin, UA: NEGATIVE
Blood, UA: NEGATIVE
Glucose, UA: NEGATIVE
Ketones, UA: NEGATIVE
Nitrite, UA: NEGATIVE
Protein, UA: NEGATIVE
Urobilinogen, UA: NEGATIVE
pH, UA: 6

## 2015-03-22 MED ORDER — VITAMIN D (ERGOCALCIFEROL) 1.25 MG (50000 UNIT) PO CAPS: ORAL_CAPSULE | ORAL | Status: DC

## 2015-03-22 MED ORDER — VALACYCLOVIR HCL 1 G PO TABS: ORAL_TABLET | ORAL | Status: DC

## 2015-03-22 NOTE — Progress Notes (Signed)
Patient ID: Vickie Perry, female   DOB: 1961-10-07, 54 y.o.   MRN: [id number] 53 y.o. [id number] Married  Caucasian Fe here for annual exam.  Doing well.   Off/ on  Phentermine per PCP and weight is stable. Strong odor to urine that is chronic and has no association to diet.  No other urinary symptoms.  She had breast lift and liposuction in January and is well pleased.  Last FSH was 49.1 on 03/02/14.  She has RX for HRT but has not decided to take. The vaso symptoms are tolerable for the most part.  Patient's last menstrual period was 03/17/2013.          Sexually active: Yes.    The current method of family planning is vasectomy and post menopausal status.    Exercising: Yes.    walking and jogging 4 days per week Smoker:  no  Health Maintenance: Pap:  12/30/12, negative with neg HR HPV MMG:  07/20/14, Bi-Rads 1:  Negative Colonoscopy:  01/2014, normal, repeat in 10 years TDaP:  2007 Labs:  HB:  12.5  Urine:  Trace leuk's   reports that she has never smoked. She has never used smokeless tobacco. She reports that she drinks about 1.8 oz of alcohol per week. She reports that she does not use illicit drugs.  Past Medical History  Diagnosis Date  . Hypertension   . Mitral regurgitation   . Palpitations   . Dyslipidemia   . Anxiety   . Allergic rhinitis   . Asthma   . Perennial allergic rhinitis   . Exercise induced bronchospasm   . Tonsillar calculus 05/2012    Dr Orpah Greek bates  . Panic attack   . HSV-2 (herpes simplex virus 2) infection   . Depression     2005  . Hypophysitis     H/O Autoimmune  . PIH (pregnancy induced hypertension)     P partum// severe   . Edema in pregnancy     Past Surgical History  Procedure Laterality Date  . Foot surgery    . Plastic eye surgery    . Cesarean section      x 2// Due to HSV2  . Ablation  2007    HTA    Current Outpatient Prescriptions  Medication Sig Dispense Refill  . ACANYA gel Apply 1 application topically as needed. fpr  acne  6  . albuterol (PROVENTIL HFA;VENTOLIN HFA) 108 (90 BASE) MCG/ACT inhaler     . aspirin 81 MG tablet Take 81 mg by mouth daily.    . clonazePAM (KLONOPIN) 1 MG tablet Take 1 mg by mouth daily.    Marland Kitchen escitalopram (LEXAPRO) 10 MG tablet Take 10 mg by mouth daily.    Marland Kitchen levocetirizine (XYZAL) 5 MG tablet Take 5 mg by mouth as needed.     . metoprolol succinate (TOPROL-XL) 25 MG 24 hr tablet Take 25 mg by mouth as needed (for palpitations).    Marland Kitchen QVAR 80 MCG/ACT inhaler Inhale 2 puffs into the lungs 2 (two) times daily.  5  . rosuvastatin (CRESTOR) 10 MG tablet Take 5 mg by mouth daily.    Marland Kitchen triamcinolone cream (KENALOG) 0.1 % Apply 1 application topically as needed.     . valACYclovir (VALTREX) 1000 MG tablet TAKE 1 TABLET BY MOUTH as needed 30 tablet 12  . valsartan (DIOVAN) 160 MG tablet Take 80 mg by mouth daily.    . Vitamin D, Ergocalciferol, (DRISDOL) 50000 UNITS CAPS capsule TAKE 1 CAPSULE  BY MOUTH ONCE A WEEK. 30 capsule 3  . estradiol (ESTRACE) 0.5 MG tablet Take 1 tablet (0.5 mg total) by mouth daily. (Patient not taking: Reported on 03/22/2015) 90 tablet 1  . medroxyPROGESTERone (PROVERA) 2.5 MG tablet Take 1 tablet (2.5 mg total) by mouth daily. (Patient not taking: Reported on 03/22/2015) 90 tablet 1  . phentermine 37.5 MG capsule Take 37.5 mg by mouth every morning.    . topiramate (TOPAMAX) 25 MG tablet Take 25 mg by mouth daily.      No current facility-administered medications for this visit.    Family History  Problem Relation Age of Onset  . Prostate cancer Father   . Heart disease Father   . Heart disease Brother   . Heart disease Mother   . Heart disease Maternal Grandfather   . Cancer Paternal Grandfather     ROS:  Pertinent items are noted in HPI.  Otherwise, a comprehensive ROS was negative.  Exam:   BP 110/66 mmHg  Pulse 68  Resp 16  Ht 5' 0.75" (1.543 m)  Wt   LMP 03/17/2013 Height: 5' 0.75" (154.3 cm) Ht Readings from Last 3 Encounters:  03/22/15 5'  0.75" (1.543 m)  02/15/15 5\' 7"  (1.702 m)  01/04/15 5' 6.5" (1.689 m)    General appearance: alert, cooperative and appears stated age Head: Normocephalic, without obvious abnormality, atraumatic Neck: no adenopathy, supple, symmetrical, trachea midline and thyroid normal to inspection and palpation Lungs: clear to auscultation bilaterally Breasts: normal appearance, no masses or tenderness, with surgical repair of bilateral breast Heart: regular rate and rhythm Abdomen: soft, non-tender; no masses,  no organomegaly Extremities: extremities normal, atraumatic, no cyanosis or edema Skin: Skin color, texture, turgor normal. No rashes or lesions Lymph nodes: Cervical, supraclavicular, and axillary nodes normal. No abnormal inguinal nodes palpated Neurologic: Grossly normal   Pelvic: External genitalia:  no lesions              Urethra:  normal appearing urethra with no masses, tenderness or lesions              Bartholin's and Skene's: normal                 Vagina: normal appearing vagina with normal color and discharge, no lesions              Cervix: anteverted              Pap taken: Yes.   Bimanual Exam:  Uterus:  normal size, contour, position, consistency, mobility, non-tender              Adnexa: no mass, fullness, tenderness               Rectovaginal: Confirms               Anus:  normal sphincter tone, no lesions  Chaperone present:  yes  A:  Well Woman with normal exam  Amenorrhea since 01/2013  Tolerable vaso symptoms - has not yet started HRT History of Vit D deficiency  Malodorous urine ? etiology   P:   Reviewed health and wellness pertinent to exam  Pap smear as above  Mammogram is due 07/2015  Refill on Vit D and Valtrex for a year  Follow with labs - repeat FSH, urine, Vit D  Did not refill HRT - as she has yet to start  Counseled with risk of DVT,CVA, cancer  Counseled on breast self exam, mammography screening, use and side  effects  of HRT, adequate intake of calcium and vitamin D, diet and exercise return annually or prn  An After Visit Summary was printed and given to the patient.

## 2015-03-22 NOTE — Patient Instructions (Addendum)

## 2015-03-23 LAB — HEMOGLOBIN, FINGERSTICK: Hemoglobin, fingerstick: 12.5 g/dL (ref 12.0–16.0)

## 2015-03-23 LAB — VITAMIN D 25 HYDROXY (VIT D DEFICIENCY, FRACTURES): Vit D, 25-Hydroxy: 42 ng/mL (ref 30–100)

## 2015-03-23 LAB — FOLLICLE STIMULATING HORMONE: FSH: 54.4 m[IU]/mL

## 2015-03-24 LAB — URINE CULTURE: Colony Count: >=100000

## 2015-03-24 LAB — IPS PAP TEST WITH HPV

## 2015-03-25 ENCOUNTER — Other Ambulatory Visit: Payer: Self-pay | Admitting: Nurse Practitioner

## 2015-03-25 MED ORDER — CIPROFLOXACIN HCL 500 MG PO TABS: 500.0000 mg | ORAL_TABLET | Freq: Two times a day (BID) | ORAL | Status: DC

## 2015-03-27 NOTE — Progress Notes (Signed)
Encounter reviewed by Dr. Brook Silva.  

## 2015-04-05 ENCOUNTER — Ambulatory Visit (INDEPENDENT_AMBULATORY_CARE_PROVIDER_SITE_OTHER): Payer: BLUE CROSS/BLUE SHIELD | Admitting: *Deleted

## 2015-04-05 VITALS — BP 96/70 | HR 68 | Temp 98.2°F | Resp 20 | Ht 60.75 in

## 2015-04-05 DIAGNOSIS — R300 Dysuria: Secondary | ICD-10-CM

## 2015-04-05 LAB — POCT URINALYSIS DIPSTICK
Bilirubin, UA: NEGATIVE
Blood, UA: NEGATIVE
Glucose, UA: NEGATIVE
Ketones, UA: NEGATIVE
Nitrite, UA: NEGATIVE
Protein, UA: NEGATIVE
Urobilinogen, UA: NEGATIVE
pH, UA: 7

## 2015-04-05 NOTE — Addendum Note (Signed)
Addended by: Elroy Channel on: 04/05/2015 03:54 PM   Modules accepted: Level of Service

## 2015-04-05 NOTE — Progress Notes (Signed)
Patient in today for TOC. Patient denies any Sx and states she completed Abx ~ 6 days ago. Patient leaves to [place] this Friday and would like to know TOC results before then.  UA dipstick in the office to check for WBC= trace.   Mrs Vickie Perry Encounter closed.

## 2015-04-07 LAB — URINE CULTURE
Colony Count: NO GROWTH
Organism ID, Bacteria: NO GROWTH

## 2015-06-11 ENCOUNTER — Other Ambulatory Visit: Payer: Self-pay | Admitting: Nurse Practitioner

## 2015-06-13 NOTE — Telephone Encounter (Signed)
Needs Mammo in September

## 2015-06-13 NOTE — Telephone Encounter (Signed)
Medication refill request: Estrace Last AEX:  03-22-15  Next AEX: 03-27-16 Last MMG (if hormonal medication request): 07-23-14 WNL Refill authorized: Please Advise

## 2015-07-20 ENCOUNTER — Encounter: Payer: Self-pay | Admitting: Sports Medicine

## 2015-07-20 ENCOUNTER — Ambulatory Visit (INDEPENDENT_AMBULATORY_CARE_PROVIDER_SITE_OTHER): Payer: BLUE CROSS/BLUE SHIELD | Admitting: Sports Medicine

## 2015-07-20 VITALS — BP 116/62 | Ht 67.0 in

## 2015-07-20 DIAGNOSIS — R269 Unspecified abnormalities of gait and mobility: Secondary | ICD-10-CM | POA: Diagnosis not present

## 2015-07-20 NOTE — Progress Notes (Signed)
   HPI  CC: shoe insoles for relief of chronic foot pain Patient is here for a follow up on her foot pain. She was provided long-term shoe insoles 2-3 years ago. Since that time she has had a great improvement in her previous metatarsalgia (R>L). She is very pleased with these inserts and would like replacements today. She has no other complaints or requests at this time.  Pain level is usually <1 now/  Able to exercise longer w no pain  Review of Systems   See HPI for ROS. No swelling in joints/ redness or skin breakdown.  No change in foot shape.   All other systems reviewed are negative.  Objective: BP 116/62 mmHg  Ht 5\' 7"  (1.702 m)  LMP 03/17/2013 Gen: NAD, alert, cooperative, and pleasant. Foot/Ankle: No visible erythema or swelling. Right sided Morton's foot appreciated.Range of motion is full in all directions. Strength is 5/5 in all directions. Stable lateral and medial ligaments; Talar dome nontender; No pain at base of 5th MT; No tenderness on posterior aspects of lateral and medial malleolus. No sign of peroneal tendon subluxations; Neuro: Alert and oriented, Speech clear, No gross deficits  Without orthotics has excess supination and over pronates on compensation giving a lot of ankle "wobble"  Patient was fitted for a : standard, cushioned, semi-rigid orthotic. The orthotic was heated and afterward the patient stood on the orthotic blank positioned on the orthotic stand. The patient was positioned in subtalar neutral position and 10 degrees of ankle dorsiflexion in a weight bearing stance. After completion of molding, a stable base was applied to the orthotic blank. The blank was ground to a stable position for weight bearing. Size: 8 red EVA Base: blue EVA Posting: MT pads Additional orthotic padding: O  Two pairs created for running shoes at beach and home Uses sports insoles in some walking shoes  Gait is much improved and comfortable in these  Time 50 mins, more  than half of which was spent with face to face time with the patient. Counseling about the above diagnosis     Assessment and plan:  ABNORMALITY OF GAIT Patient was provided long-term shoe inserts in 2013. Since that time her metatarsalgia has resolved. She is here today for replacement inserts.  - 2 pairs of long-term shoe insoles were made and given to patient. - f/u PRN   Elberta Leatherwood, MD,MS,  PGY2 07/20/2015 9:38 PM   I supervised and agreed with documentation.   Ila Mcgill, MD

## 2015-07-20 NOTE — Assessment & Plan Note (Signed)
Patient was provided long-term shoe inserts in 2013. Since that time her metatarsalgia has resolved. She is here today for replacement inserts.  - 2 pairs of long-term shoe insoles were made and given to patient. - f/u PRN

## 2015-08-15 ENCOUNTER — Telehealth: Payer: Self-pay | Admitting: *Deleted

## 2015-08-15 NOTE — Telephone Encounter (Signed)
"  I'm getting ready to have a MRI and they're asking me what type of pin do I have in my foot.  Is it Titanium?"  No, Dr. Paulla Dolly said it isn't titanium.  "Is it okay to have the MRI?"  Dr. Paulla Dolly said it is okay.  They haven't caused any problems with anyone in the past.  Dr. Jacqualyn Posey said it is stainless steel.  No problems that he's aware of either in the past with any patients.  "Okay, thank you."

## 2015-08-29 ENCOUNTER — Ambulatory Visit (INDEPENDENT_AMBULATORY_CARE_PROVIDER_SITE_OTHER): Payer: BLUE CROSS/BLUE SHIELD | Admitting: Sports Medicine

## 2015-08-29 ENCOUNTER — Encounter: Payer: Self-pay | Admitting: Sports Medicine

## 2015-08-29 ENCOUNTER — Ambulatory Visit
Admission: RE | Admit: 2015-08-29 | Discharge: 2015-08-29 | Disposition: A | Discharge status: Home / Self Care | Payer: BLUE CROSS/BLUE SHIELD | Source: Ambulatory Visit | Attending: Sports Medicine | Admitting: Sports Medicine

## 2015-08-29 VITALS — BP 117/76 | Ht 67.0 in | Wt 180.0 lb

## 2015-08-29 DIAGNOSIS — M25562 Pain in left knee: Secondary | ICD-10-CM

## 2015-08-29 DIAGNOSIS — M25462 Effusion, left knee: Secondary | ICD-10-CM | POA: Diagnosis not present

## 2015-08-29 NOTE — Progress Notes (Signed)
   Subjective:    Patient ID: Vickie Perry, female    DOB: 16-Jun-1961, 54 y.o.   MRN: [id number]  HPI chief complaint: Left knee pain  Very pleasant 54 year old female comes in today complaining of one month of left knee pain. Pain began after she fell forward onto her left patella. She had some bruising and swelling at the time but very little in the way of pain. About a week later she began to notice return swelling especially with activity. This was accompanied by pain primarily along the medial knee but with radiating pain in the posterior knee as well. The knee will get tight with prolonged sitting or with activity. She has been unable to return to her workout program due to the fact that squatting and running reproduces her pain. Pain is sharp in quality. No locking. No pain at rest. No feelings of instability. No prior knee surgeries. No prior knee injuries.  Past medical history reviewed Medications reviewed Allergies reviewed    Review of Systems    as above Objective:   Physical Exam  Well-developed, well-nourished. No acute distress. Awake alert and oriented 3. Vital signs reviewed.  Left knee: Range of motion is 0-120. Trace to 1+ effusion. She is tender to palpation along the medial joint line. Pain but no popping with McMurray's. Positive Thessaly's. Knee is stable to valgus and varus stressing. Patient does have 1+ laxity with anterior and posterior drawer testing but this is identical to her uninvolved right knee. Negative patellar apprehension. No soft tissue swelling. No ecchymosis. Extensor mechanism is intact. Neurovascularly intact distally. Walking with a slight limp.      Assessment & Plan:  Left knee pain worrisome for medial meniscal tear  History and physical exam findings are consistent with a possible medial meniscal tear. I will start with getting plain x-rays of the left knee. We will also schedule an MRI of the left knee specifically to rule out  a large meniscal tear which may need surgical intervention. Patient will follow-up with me 2-3 days after the MRI to discuss the results and delineate further treatment. In the meantime I have provided her with a body helix compression sleeve to wear when active but I have asked her to avoid repetitive impact activities or jumping/squatting. I've also recommended over-the-counter Aleve 1 pill twice daily for 2-3 days and then as needed for pain and swelling.

## 2015-09-07 ENCOUNTER — Ambulatory Visit
Admission: RE | Admit: 2015-09-07 | Discharge: 2015-09-07 | Disposition: A | Discharge status: Home / Self Care | Payer: BLUE CROSS/BLUE SHIELD | Source: Ambulatory Visit | Attending: Sports Medicine | Admitting: Sports Medicine

## 2015-09-07 ENCOUNTER — Other Ambulatory Visit: Payer: Self-pay | Admitting: *Deleted

## 2015-09-07 DIAGNOSIS — M25562 Pain in left knee: Secondary | ICD-10-CM

## 2015-09-12 ENCOUNTER — Ambulatory Visit (INDEPENDENT_AMBULATORY_CARE_PROVIDER_SITE_OTHER): Payer: BLUE CROSS/BLUE SHIELD | Admitting: Sports Medicine

## 2015-09-12 ENCOUNTER — Encounter: Payer: Self-pay | Admitting: Sports Medicine

## 2015-09-12 ENCOUNTER — Ambulatory Visit
Admission: RE | Admit: 2015-09-12 | Discharge: 2015-09-12 | Disposition: A | Discharge status: Home / Self Care | Payer: BLUE CROSS/BLUE SHIELD | Source: Ambulatory Visit | Attending: Sports Medicine | Admitting: Sports Medicine

## 2015-09-12 VITALS — BP 115/61 | HR 74 | Ht 67.0 in | Wt 180.0 lb

## 2015-09-12 DIAGNOSIS — M79671 Pain in right foot: Secondary | ICD-10-CM | POA: Diagnosis not present

## 2015-09-12 DIAGNOSIS — M25462 Effusion, left knee: Secondary | ICD-10-CM | POA: Diagnosis not present

## 2015-09-12 DIAGNOSIS — M25562 Pain in left knee: Secondary | ICD-10-CM | POA: Diagnosis not present

## 2015-09-12 MED ORDER — METHYLPREDNISOLONE ACETATE 40 MG/ML IJ SUSP
40.0000 mg | Freq: Once | INTRAMUSCULAR | Status: AC
  Administered 2015-09-12: 40 mg via INTRA_ARTICULAR

## 2015-09-12 MED ORDER — MELOXICAM 15 MG PO TABS: ORAL_TABLET | ORAL | Status: DC

## 2015-09-12 NOTE — Progress Notes (Signed)
   Subjective:    Patient ID: Vickie Perry, female    DOB: Jan 04, 1961, 54 y.o.   MRN: [id number]  HPI   Patient comes in today to discuss MRI findings of her left knee. MRI shows mild tricompartmental degenerative chondrosis most advanced in the patellofemoral joint. No obvious meniscal tear. No obvious loose body. Moderate size joint effusion. Over-the-counter Aleve has not been of much help. She has found the compression sleeve to be uncomfortable. Overall her symptoms are about the same. She is also complaining of some right foot pain. She had bunion surgery several years ago and most recently has begun to notice some discomfort across the dorsum of her first metatarsal. She also notices a "bump" which is uncomfortable. Her surgery was done by Dr. Paulla Dolly. She is requesting the name of another foot doctor as she is considering surgery on her left foot as well.    Review of Systems     Objective:   Physical Exam Will develop, well-nourished. No acute distress. Awake alert and oriented 3. Vital signs reviewed  Right foot: There is a slightly tender palpable area of induration across the dorsum of the first metatarsal which may be consistent with hardware from her prior surgery. No surrounding erythema. No edema. Limited MTP motion. Brisk capillary refill.  Left knee: Full range of motion. Trace effusion. Trace patellofemoral crepitus. She is tender to palpation along the medial joint line with pain but no popping with McMurray's. Good ligamentous stability. Neurovascularly intact distally.       Assessment & Plan:  Left knee pain and swelling with MRI evidence of mild DJD/chondromalacia Right foot pain possibly secondary to surgical hardware from prior bunion surgery  For her left knee I recommend a cortisone injection. She is in agreement with this. This was accomplished atraumatically under sterile technique utilizing an anterior lateral approach. She tolerated this without  difficulty. I've also given her a prescription for meloxicam 15 mg to take as needed for pain and swelling. She will wait about a week before resuming her exercise program. If symptoms persist despite today's injection then I would consider referral to orthopedics to discuss merits of a diagnostic arthroscopy. Although she had a moderate size effusion on her MRI she has only a trace effusion today.  For her right foot pain I will get an x-ray specifically to see if her area of pain correlates with underlying surgical hardware. I will call her with those results once available. I've also provided her with the name of Dr. Wylene Simmer at [facility] orthopedics.  Follow-up with me in the office as needed.  Consent obtained and verified. Time-out conducted. Noted no overlying erythema, induration, or other signs of local infection. Skin prepped in a sterile fashion. Topical analgesic spray: Ethyl chloride. Joint: left knee Needle: 22g 1.5 inch Completed without difficulty. Meds: 3cc 1% xylocaine, 1cc (40mg ) depomedrol  Advised to call if fevers/chills, erythema, induration, drainage, or persistent bleeding.

## 2015-09-12 NOTE — Patient Instructions (Signed)
[facility] Dr. Wylene Simmer [address], [place], [place] [address] Phone: [phone]

## 2015-09-14 ENCOUNTER — Telehealth: Payer: Self-pay | Admitting: Sports Medicine

## 2015-09-14 NOTE — Telephone Encounter (Signed)
I spoke with the patient on the phone today after reviewing the x-ray of her right foot. The marker does correlate with the tip of the surgical pin which was placed as part of her bunionectomy a few years ago. She also has arthritis in this foot. She is going to see Dr. Doran Durand sometime in the near future to discuss bunionectomy of her left foot and I've recommended that she discuss pin removal from her right foot at that visit. She will follow-up with me as needed.

## 2015-11-10 ENCOUNTER — Ambulatory Visit: Payer: BLUE CROSS/BLUE SHIELD | Admitting: Podiatry

## 2015-11-17 ENCOUNTER — Ambulatory Visit: Payer: Self-pay

## 2015-11-17 ENCOUNTER — Ambulatory Visit (INDEPENDENT_AMBULATORY_CARE_PROVIDER_SITE_OTHER): Payer: BLUE CROSS/BLUE SHIELD | Admitting: Podiatry

## 2015-11-17 ENCOUNTER — Encounter: Payer: Self-pay | Admitting: Podiatry

## 2015-11-17 VITALS — BP 127/78 | HR 74 | Resp 16

## 2015-11-17 DIAGNOSIS — M21619 Bunion of unspecified foot: Secondary | ICD-10-CM

## 2015-11-17 DIAGNOSIS — M779 Enthesopathy, unspecified: Secondary | ICD-10-CM

## 2015-11-17 MED ORDER — TRIAMCINOLONE ACETONIDE 10 MG/ML IJ SUSP
10.0000 mg | Freq: Once | INTRAMUSCULAR | Status: AC
  Administered 2015-11-17: 10 mg

## 2015-11-20 NOTE — Progress Notes (Signed)
Subjective:     Patient ID: Vickie Perry, female   DOB: 09/10/1961, 55 y.o.   MRN: [id number]  HPI patient presents stating I have had pain on the inside of my right first metatarsal right had surgery secondary to trauma and also developing a structural bunion left that I know I need to get fixed someday   Review of Systems  All other systems reviewed and are negative.      Objective:   Physical Exam  Constitutional: She is oriented to person, place, and time.  Cardiovascular: Intact distal pulses.   Musculoskeletal: Normal range of motion.  Neurological: She is oriented to person, place, and time.  Skin: Skin is warm.  Nursing note and vitals reviewed.  neurovascular status intact muscle strength adequate range of motion within normal limits with patient found to have structural bunion deformity left and also discomfort on the inside of the right foot with a well-healed surgical site and pain that is not related to the previous pin that had been placed. Good digital perfusion and well oriented 3     Assessment:     Structural HAV deformity left with moderate pain and also probable tendinitis inflammatory changes right medial foot    Plan:     H&P and x-rays of both feet reviewed. At this time the bunion can't be corrected left and she will choose best time to do it and I educated her on this and for the right I did a careful medial injection 3 mg Kenalog 5 mg Xylocaine and instructed on soaks therapy. If symptoms persist she'll reappoint and will be seen back again when she decides on structural correction

## 2015-12-09 ENCOUNTER — Other Ambulatory Visit: Payer: Self-pay | Admitting: *Deleted

## 2015-12-09 MED ORDER — MELOXICAM 15 MG PO TABS: ORAL_TABLET | ORAL | Status: DC

## 2016-01-16 ENCOUNTER — Other Ambulatory Visit: Payer: Self-pay | Admitting: *Deleted

## 2016-01-16 MED ORDER — MELOXICAM 15 MG PO TABS: ORAL_TABLET | ORAL | Status: DC

## 2016-02-01 ENCOUNTER — Encounter: Payer: Self-pay | Admitting: Certified Nurse Midwife

## 2016-02-01 ENCOUNTER — Ambulatory Visit (INDEPENDENT_AMBULATORY_CARE_PROVIDER_SITE_OTHER): Payer: BLUE CROSS/BLUE SHIELD | Admitting: Certified Nurse Midwife

## 2016-02-01 VITALS — BP 102/66 | HR 64 | Temp 98.1°F | Resp 18 | Ht 67.0 in

## 2016-02-01 DIAGNOSIS — R300 Dysuria: Secondary | ICD-10-CM | POA: Diagnosis not present

## 2016-02-01 DIAGNOSIS — N390 Urinary tract infection, site not specified: Secondary | ICD-10-CM

## 2016-02-01 LAB — POCT URINALYSIS DIPSTICK
Bilirubin, UA: NEGATIVE
Blood, UA: NEGATIVE
Glucose, UA: NEGATIVE
Ketones, UA: NEGATIVE
Nitrite, UA: POSITIVE
Protein, UA: NEGATIVE
Urobilinogen, UA: NEGATIVE
pH, UA: 5

## 2016-02-01 MED ORDER — NITROFURANTOIN MONOHYD MACRO 100 MG PO CAPS: 100.0000 mg | ORAL_CAPSULE | Freq: Two times a day (BID) | ORAL | Status: DC

## 2016-02-01 NOTE — Patient Instructions (Signed)
Asymptomatic Bacteriuria, Female Asymptomatic bacteriuria is the presence of a large number of bacteria in your urine without the usual symptoms of burning or frequent urination. The following conditions increase the risk of asymptomatic bacteriuria:  Diabetes mellitus.  Advanced age.  Pregnancy in the first trimester.  Kidney stones.  Kidney transplants.  Leaky kidney tube valve in young children (reflux). Treatment for this condition is not needed in most people and can lead to other problems such as too much yeast and growth of resistant bacteria. However, some people, such as pregnant women, do need treatment to prevent kidney infection. Asymptomatic bacteriuria in pregnancy is also associated with fetal growth restriction, premature labor, and newborn death. HOME CARE INSTRUCTIONS Monitor your condition for any changes. The following actions may help to relieve any discomfort you are feeling:  Drink enough water and fluids to keep your urine clear or pale yellow. Go to the bathroom more often to keep your bladder empty.  Keep the area around your vagina and rectum clean. Wipe yourself from front to back after urinating. SEEK IMMEDIATE MEDICAL CARE IF:  You develop signs of an infection such as:  Burning with urination.  Frequency of voiding.  Back pain.  Fever.  You have blood in the urine.  You develop a fever. MAKE SURE YOU:  Understand these instructions.  Will watch your condition.  Will get help right away if you are not doing well or get worse.   This information is not intended to replace advice given to you by your health care provider. Make sure you discuss any questions you have with your health care provider.   Document Released: 10/22/2005 Document Revised: 11/12/2014 Document Reviewed: 04/13/2013 Elsevier Interactive Patient Education 2016 Elsevier Inc.  

## 2016-02-01 NOTE — Progress Notes (Signed)
Encounter reviewed Vickie Stoney, MD   

## 2016-02-01 NOTE — Progress Notes (Signed)
Patient ID: Vickie Perry, female   DOB: Feb 12, 1961, 55 y.o.   MRN: [id number]  55 y.o. Married Caucasian female [id number] here with complaint of UTI, with onset two weeks ago. Patient complaining of urinary  Pressure and urinary odor and pain with urination and cloudy. Patient denies fever, chills, nausea or back pain. No new personal products. Patient feels may be related to sexual activity. Denies any vaginal symptoms.    Contraception is menopausal..Denies any vaginal dryness. Patient not having adequate water intake. Drinks soda daily and then water at times. No other health concerns today.   O: Healthy female WDWN Affect: Normal, orientation x 3 Skin : warm and dry CVAT: negative  bilateral Abdomen: positive for suprapubic tenderness  Pelvic exam: External genital area: normal, no lesions Bladder,Urethra tender, Urethral meatus: tender, red Vagina: normal vaginal discharge, normal appearance, no vaginal dryness noted   Cervix: normal, non tender Uterus:normal,non tender Adnexa: normal non tender, no fullness or masses   A: UTI  Normal pelvic exam Menopausal  P: Reviewed findings of UTI and need for treatment. Discussed can occur after sexual activity and need to empty bladder after sexual activity for prevention. Has never had this type of UTI, without any major symptoms. Also discussed odor,indicates bacteria in urine and may be the only symptom she has. Questions addressed [id number] see order with instructions [id number] culture Reviewed warning signs and symptoms of UTI and need to advise if occurring. Encouraged to limit soda, tea, and coffee and be sure to increase water intake.   RV prn

## 2016-02-03 LAB — URINE CULTURE: Colony Count: >=100000

## 2016-02-07 ENCOUNTER — Telehealth: Payer: Self-pay

## 2016-02-07 NOTE — Telephone Encounter (Signed)
Spoke with patient. Advised of message and results as seen below from [place]. Patient reports she feels much better since starting Macrobid. Advised if symptoms return she will need to contact the office for urine recheck. She is agreeable.  Routing to provider for final review. Patient agreeable to disposition. Will close encounter.

## 2016-02-07 NOTE — Telephone Encounter (Signed)
lmtcb

## 2016-02-07 NOTE — Telephone Encounter (Signed)
-----   Message from Regina Eck, CNM sent at 02/07/2016  8:04 AM EDT ----- Notify patient that urine culture was positive for E. Coli as we suspected and she is on correct medication If patient status is improved, no TOC needed.

## 2016-03-23 ENCOUNTER — Ambulatory Visit: Payer: BLUE CROSS/BLUE SHIELD | Admitting: Nurse Practitioner

## 2016-03-27 ENCOUNTER — Ambulatory Visit: Payer: BLUE CROSS/BLUE SHIELD | Admitting: Nurse Practitioner

## 2016-03-27 ENCOUNTER — Ambulatory Visit (INDEPENDENT_AMBULATORY_CARE_PROVIDER_SITE_OTHER): Payer: BLUE CROSS/BLUE SHIELD | Admitting: Nurse Practitioner

## 2016-03-27 ENCOUNTER — Encounter: Payer: Self-pay | Admitting: Nurse Practitioner

## 2016-03-27 VITALS — BP 112/74 | HR 68 | Ht 66.25 in

## 2016-03-27 DIAGNOSIS — E7800 Pure hypercholesterolemia, unspecified: Secondary | ICD-10-CM

## 2016-03-27 DIAGNOSIS — Z01419 Encounter for gynecological examination (general) (routine) without abnormal findings: Secondary | ICD-10-CM | POA: Diagnosis not present

## 2016-03-27 DIAGNOSIS — N951 Menopausal and female climacteric states: Secondary | ICD-10-CM

## 2016-03-27 DIAGNOSIS — E559 Vitamin D deficiency, unspecified: Secondary | ICD-10-CM

## 2016-03-27 DIAGNOSIS — Z0000 Encounter for general adult medical examination without abnormal findings: Secondary | ICD-10-CM

## 2016-03-27 DIAGNOSIS — Z23 Encounter for immunization: Secondary | ICD-10-CM | POA: Diagnosis not present

## 2016-03-27 LAB — COMPREHENSIVE METABOLIC PANEL
ALT: 15 U/L (ref 6–29)
AST: 20 U/L (ref 10–35)
Albumin: 4.5 g/dL (ref 3.6–5.1)
Alkaline Phosphatase: 70 U/L (ref 33–130)
BUN: 14 mg/dL (ref 7–25)
CO2: 23 mmol/L (ref 20–31)
Calcium: 9.6 mg/dL (ref 8.6–10.4)
Chloride: 102 mmol/L (ref 98–110)
Creat: 0.84 mg/dL (ref 0.50–1.05)
Glucose, Bld: 86 mg/dL (ref 65–99)
Potassium: 4.2 mmol/L (ref 3.5–5.3)
Sodium: 138 mmol/L (ref 135–146)
Total Bilirubin: 0.8 mg/dL (ref 0.2–1.2)
Total Protein: 7.2 g/dL (ref 6.1–8.1)

## 2016-03-27 LAB — HEMOGLOBIN A1C
Hgb A1c MFr Bld: 6.2 % — ABNORMAL HIGH (ref <5.7)
Mean Plasma Glucose: 131 mg/dL

## 2016-03-27 LAB — POCT URINALYSIS DIPSTICK
Bilirubin, UA: NEGATIVE
Blood, UA: NEGATIVE
Glucose, UA: NEGATIVE
Ketones, UA: NEGATIVE
Leukocytes, UA: NEGATIVE
Nitrite, UA: NEGATIVE
Protein, UA: NEGATIVE
Urobilinogen, UA: NEGATIVE
pH, UA: 6.5

## 2016-03-27 LAB — CBC WITH DIFFERENTIAL/PLATELET
Basophils Absolute: 51 cells/uL (ref 0–200)
Basophils Relative: 1 %
Eosinophils Absolute: 153 cells/uL (ref 15–500)
Eosinophils Relative: 3 %
HCT: 38.8 % (ref 35.0–45.0)
Hemoglobin: 12.9 g/dL (ref 11.7–15.5)
Lymphocytes Relative: 29 %
Lymphs Abs: 1479 cells/uL (ref 850–3900)
MCH: 28.4 pg (ref 27.0–33.0)
MCHC: 33.2 g/dL (ref 32.0–36.0)
MCV: 85.5 fL (ref 80.0–100.0)
MPV: 11 fL (ref 7.5–12.5)
Monocytes Absolute: 357 cells/uL (ref 200–950)
Monocytes Relative: 7 %
Neutro Abs: 3060 cells/uL (ref 1500–7800)
Neutrophils Relative %: 60 %
Platelets: 291 10*3/uL (ref 140–400)
RBC: 4.54 MIL/uL (ref 3.80–5.10)
RDW: 13.8 % (ref 11.0–15.0)
WBC: 5.1 10*3/uL (ref 3.8–10.8)

## 2016-03-27 LAB — LIPID PANEL
Cholesterol: 172 mg/dL (ref 125–200)
HDL: 58 mg/dL (ref >=46)
LDL Cholesterol: 81 mg/dL (ref <130)
Total CHOL/HDL Ratio: 3 Ratio (ref <=5.0)
Triglycerides: 164 mg/dL — ABNORMAL HIGH (ref <150)
VLDL: 33 mg/dL — ABNORMAL HIGH (ref <30)

## 2016-03-27 NOTE — Patient Instructions (Signed)

## 2016-03-27 NOTE — Progress Notes (Signed)
Patient ID: Vickie Perry, female   DOB: 01-25-1961, 55 y.o.   MRN: [id number]  55 y.o. [id number] Married  Caucasian Fe here for annual exam.  Some vaso symptoms that are tolerable.  Uses OTC lubrication for dryness.  She has gotten RX for HRT filled but never took it.  She has Rx on hand in case she decides.  Last [facility] was 54.4   Daughter just graduated from [employer] and accepted a job in [place].  Her son and his girlfriend are expecting her first grandchild.  This was an unplanned pregnancy.  Patient's last menstrual period was 03/17/2013 (exact date).          Sexually active: Yes.    The current method of family planning is vasectomy and post menopausal status.    Exercising: Yes.    walking. Smoker:  no  Health Maintenance: Pap:03/22/15 negative with neg HR HPV MMG:07/29/15, Bi-Rads 1: Negative Colonoscopy: 01/2014, normal, repeat in 10 years TDaP:Today Shingles: Not indicated due to age  Pneumonia: Not indicated due to age Hep C and HIV: done today Labs: HB: 12.9  Urine: negative   reports that she has never smoked. She has never used smokeless tobacco. She reports that she drinks about 1.8 oz of alcohol per week. She reports that she does not use illicit drugs.  Past Medical History  Diagnosis Date  . Hypertension   . Mitral regurgitation   . Palpitations   . Dyslipidemia   . Anxiety   . Allergic rhinitis   . Asthma   . Perennial allergic rhinitis   . Exercise induced bronchospasm   . Tonsillar calculus 05/2012    Dr Orpah Greek bates  . Panic attack   . HSV-2 (herpes simplex virus 2) infection   . Depression     2005  . Hypophysitis ([place])     H/O Autoimmune  . PIH (pregnancy induced hypertension)     P partum// severe   . Edema in pregnancy     Past Surgical History  Procedure Laterality Date  . Foot surgery    . Plastic eye surgery    . Cesarean section      x 2// Due to HSV2  . Ablation  2007    HTA    Current Outpatient Prescriptions   Medication Sig Dispense Refill  . albuterol (PROVENTIL HFA;VENTOLIN HFA) 108 (90 BASE) MCG/ACT inhaler     . aspirin 81 MG tablet Take 81 mg by mouth daily.    . Clindamycin-Benzoyl Per, Refr, gel Reported on 11/17/2015  6  . clonazePAM (KLONOPIN) 1 MG tablet Take 1 mg by mouth daily.    Marland Kitchen olopatadine (PATANOL) 0.1 % ophthalmic solution APPLY 1 DROP INTO BOTH EYES TWICE A DAY  3  . QVAR 80 MCG/ACT inhaler Inhale 2 puffs into the lungs 2 (two) times daily.  5  . rosuvastatin (CRESTOR) 10 MG tablet Take 5 mg by mouth daily.    . sertraline (ZOLOFT) 50 MG tablet TAKE 1/2 TABLET BY MOUTH FOR 1 WEEK, THEN 1 TABLET DAILY  1  . triamcinolone cream (KENALOG) 0.1 % Apply 1 application topically as needed. Reported on 11/17/2015    . valsartan (DIOVAN) 160 MG tablet Take 80 mg by mouth daily.     No current facility-administered medications for this visit.    Family History  Problem Relation Age of Onset  . Prostate cancer Father   . Heart disease Father   . Hyperlipidemia Brother   . Heart disease Mother   .  Heart disease Maternal Grandfather   . Heart disease Brother   . Hyperlipidemia Brother   . Heart disease Brother   . Hyperlipidemia Brother   . Hyperlipidemia Brother   . Hyperlipidemia Brother     ROS:  Pertinent items are noted in HPI.  Otherwise, a comprehensive ROS was negative.  Exam:   BP 112/74 mmHg  Pulse 68  Ht 5' 6.25" (1.683 m)  Wt   LMP 03/17/2013 (Exact Date) Height: 5' 6.25" (168.3 cm) Ht Readings from Last 3 Encounters:  03/27/16 5' 6.25" (1.683 m)  02/01/16 5\' 7"  (1.702 m)  09/12/15 5\' 7"  (1.702 m)    General appearance: alert, cooperative and appears stated age Head: Normocephalic, without obvious abnormality, atraumatic Neck: no adenopathy, supple, symmetrical, trachea midline and thyroid normal to inspection and palpation Lungs: clear to auscultation bilaterally Breasts: normal appearance, no masses or tenderness Heart: regular rate and rhythm Abdomen:  soft, non-tender; no masses,  no organomegaly Extremities: extremities normal, atraumatic, no cyanosis or edema Skin: Skin color, texture, turgor normal. No rashes or lesions Lymph nodes: Cervical, supraclavicular, and axillary nodes normal. No abnormal inguinal nodes palpated Neurologic: Grossly normal   Pelvic: External genitalia:  no lesions              Urethra:  normal appearing urethra with no masses, tenderness or lesions              Bartholin's and Skene's: normal                 Vagina: normal appearing vagina with normal color and discharge, no lesions              Cervix: anteverted              Pap taken: No. Bimanual Exam:  Uterus:  normal size, contour, position, consistency, mobility, non-tender              Adnexa: no mass, fullness, tenderness               Rectovaginal: Confirms               Anus:  normal sphincter tone, no lesions  Chaperone present: no  A:  Well Woman with normal exam  Amenorrhea since 01/2013 Tolerable vaso symptoms - has not yet started HRT History of Vit D deficiency, hypercholesterolemia  Situational stressors  Update immunization   P:   Reviewed health and wellness pertinent to exam  Pap smear as above  Mammogram is due 07/2016  No refills of HRT is indicated at this time  Will follow with fasting labs  TDaP is given today  Counseled on breast self exam, mammography screening, use and side effects of HRT, adequate intake of calcium and vitamin D, diet and exercise return annually or prn  An After Visit Summary was printed and given to the patient.

## 2016-03-28 LAB — VITAMIN D 25 HYDROXY (VIT D DEFICIENCY, FRACTURES): Vit D, 25-Hydroxy: 25 ng/mL — ABNORMAL LOW (ref 30–100)

## 2016-03-28 LAB — HEPATITIS C ANTIBODY: HCV Ab: NEGATIVE

## 2016-03-28 LAB — TSH: TSH: 0.76 mIU/L

## 2016-03-28 LAB — HIV ANTIBODY (ROUTINE TESTING W REFLEX): HIV 1&2 Ab, 4th Generation: NONREACTIVE

## 2016-03-28 LAB — FOLLICLE STIMULATING HORMONE: FSH: 49.1 m[IU]/mL

## 2016-03-29 LAB — HEMOGLOBIN, FINGERSTICK: Hemoglobin, fingerstick: 12.9 g/dL (ref 12.0–16.0)

## 2016-04-02 NOTE — Progress Notes (Signed)
Encounter reviewed by Dr. Brook Amundson C. Silva.  

## 2016-04-04 ENCOUNTER — Ambulatory Visit (INDEPENDENT_AMBULATORY_CARE_PROVIDER_SITE_OTHER): Payer: BLUE CROSS/BLUE SHIELD | Admitting: Cardiology

## 2016-04-04 ENCOUNTER — Encounter: Payer: Self-pay | Admitting: Cardiology

## 2016-04-04 VITALS — BP 106/70 | HR 61 | Ht 67.0 in

## 2016-04-04 DIAGNOSIS — R002 Palpitations: Secondary | ICD-10-CM

## 2016-04-04 DIAGNOSIS — I340 Nonrheumatic mitral (valve) insufficiency: Secondary | ICD-10-CM

## 2016-04-04 DIAGNOSIS — I10 Essential (primary) hypertension: Secondary | ICD-10-CM

## 2016-04-04 MED ORDER — METOPROLOL TARTRATE 25 MG PO TABS: 12.5000 mg | ORAL_TABLET | Freq: Every day | ORAL | Status: DC | PRN

## 2016-04-04 NOTE — Progress Notes (Signed)
Cardiology Office Note    Date:  04/04/2016   ID:  Vickie Perry, DOB [birth year], MRN [id number]  PCP:  Vickie Screws, MD  Cardiologist:  Vickie Him, MD   Chief Complaint  Patient presents with  . Follow-up    1 year  . Hypertension  . Palpitations    History of Present Illness:  Vickie Perry is a 55 y.o. female with a history of mild MR/AR/PR, HTN and history of palpitations with no significant arrhythmias on heart monitor who presents today. She is doing well. She denies any chest pain, SOB, DOE, LE edema, dizziness or syncope.  She rarely has any palpitations and has only had to take PRN metoprolol a few times.She is exercising several days weekly.     Past Medical History  Diagnosis Date  . Hypertension   . Mitral regurgitation   . Palpitations   . Dyslipidemia   . Anxiety   . Allergic rhinitis   . Asthma   . Perennial allergic rhinitis   . Exercise induced bronchospasm   . Tonsillar calculus 05/2012    Dr Vickie Perry  . Panic attack   . HSV-2 (herpes simplex virus 2) infection   . Depression     2005  . Hypophysitis ([place])     H/O Autoimmune  . PIH (pregnancy induced hypertension)     P partum// severe   . Edema in pregnancy     Past Surgical History  Procedure Laterality Date  . Foot surgery    . Plastic eye surgery    . Cesarean section      x 2// Due to HSV2  . Ablation  2007    HTA    Current Medications: Outpatient Prescriptions Prior to Visit  Medication Sig Dispense Refill  . albuterol (PROVENTIL HFA;VENTOLIN HFA) 108 (90 BASE) MCG/ACT inhaler Inhale 2 puffs into the lungs every 4 (four) hours as needed for wheezing or shortness of breath.     Marland Kitchen aspirin 81 MG tablet Take 81 mg by mouth daily.    . Clindamycin-Benzoyl Per, Refr, gel Reported on 11/17/2015  6  . clonazePAM (KLONOPIN) 1 MG tablet Take 1 mg by mouth daily.    Marland Kitchen olopatadine (PATANOL) 0.1 % ophthalmic solution APPLY 1 DROP INTO BOTH EYES TWICE A  DAY  3  . QVAR 80 MCG/ACT inhaler Inhale 2 puffs into the lungs 2 (two) times daily.  5  . rosuvastatin (CRESTOR) 10 MG tablet Take 5 mg by mouth daily.    . sertraline (ZOLOFT) 50 MG tablet TAKE 1/2 TABLET BY MOUTH FOR 1 WEEK, THEN 1 TABLET DAILY  1  . triamcinolone cream (KENALOG) 0.1 % Apply 1 application topically as needed. Reported on 11/17/2015    . valsartan (DIOVAN) 160 MG tablet Take 80 mg by mouth daily.     No facility-administered medications prior to visit.     Allergies:   Erythromycin   Social History   Social History  . Marital Status: Married    Spouse Name: N/A  . Number of Children: N/A  . Years of Education: N/A   Social History Main Topics  . Smoking status: Never Smoker   . Smokeless tobacco: Never Used  . Alcohol Use: 1.8 oz/week    3 Glasses of wine per week     Comment: per week  . Drug Use: No  . Sexual Activity: Yes    Birth Control/ Protection: Other-see comments     Comment: vasectomy   Other  Topics Concern  . None   Social History Narrative     Family History:  The patient's family history includes Heart disease in her brother, brother, father, maternal grandfather, and mother; Hyperlipidemia in her brother, brother, brother, brother, and brother; Prostate cancer in her father.   ROS:   Please see the history of present illness.    ROS All other systems reviewed and are negative.   PHYSICAL EXAM:   VS:  BP 106/70 mmHg  Pulse 61  Ht 5\' 7"  (1.702 m)  LMP 03/17/2013 (Exact Date)   GEN: Well nourished, well developed, in no acute distress HEENT: normal Neck: no JVD, carotid bruits, or masses Cardiac: RRR; no murmurs, rubs, or gallops,no edema.  Intact distal pulses bilaterally.  Respiratory:  clear to auscultation bilaterally, normal work of breathing GI: soft, nontender, nondistended, + BS MS: no deformity or atrophy Skin: warm and dry, no rash Neuro:  Alert and Oriented x 3, Strength and sensation are intact Psych: euthymic mood,  full affect  Wt Readings from Last 3 Encounters:  09/12/15 180 lb (81.647 kg)  08/29/15 180 lb (81.647 kg)  12/03/13 198 lb 6.4 oz (89.994 kg)      Studies/Labs Reviewed:   EKG:  EKG is ordered today.  The ekg ordered today demonstrates NSR at 61bpm with no ST changes  Recent Labs: 03/27/2016: ALT 15; BUN 14; Creat 0.84; Hemoglobin 12.9; Platelets 291; Potassium 4.2; Sodium 138; TSH 0.76   Lipid Panel    Component Value Date/Time   CHOL 172 03/27/2016 1535   TRIG 164* 03/27/2016 1535   HDL 58 03/27/2016 1535   CHOLHDL 3.0 03/27/2016 1535   VLDL 33* 03/27/2016 1535   LDLCALC 81 03/27/2016 1535    Additional studies/ records that were reviewed today include:  none    ASSESSMENT:    1. Benign essential HTN   2. Mitral regurgitation   3. Palpitations      PLAN:  In order of problems listed above:  1. HTN - BP controlled on current medical regimen.  Continue Valsartan. 2. Trivial MR on echo 3. Palpitations with no arrhythmias on monitor. Palpitations are very infrequent.    Medication Adjustments/Labs and Tests Ordered: Current medicines are reviewed at length with the patient today.  Concerns regarding medicines are outlined above.  Medication changes, Labs and Tests ordered today are listed in the Patient Instructions below.  There are no Patient Instructions on file for this visit.   Signed, Vickie Him, MD  04/04/2016 9:15 AM    [facility], [place], [place]  [address] Phone: [phone]; Fax: [phone]

## 2016-04-04 NOTE — Patient Instructions (Signed)
Medication Instructions:  1) TAKE METOPROLOL 25 mg 1/2 tablet (for a total of 12.5 mg) daily as needed for palpitations.  Labwork: None  Testing/Procedures: None  Follow-Up: Your physician wants you to follow-up in: 1 year with Dr. Radford Pax. You will receive a reminder letter in the mail two months in advance. If you don't receive a letter, please call our office to schedule the follow-up appointment.   Any Other Special Instructions Will Be Listed Below (If Applicable).     If you need a refill on your cardiac medications before your next appointment, please call your pharmacy.

## 2016-05-10 ENCOUNTER — Other Ambulatory Visit: Payer: Self-pay | Admitting: *Deleted

## 2016-05-10 MED ORDER — METOPROLOL TARTRATE 25 MG PO TABS: 12.5000 mg | ORAL_TABLET | Freq: Every day | ORAL | Status: DC | PRN

## 2017-03-27 ENCOUNTER — Ambulatory Visit: Payer: BLUE CROSS/BLUE SHIELD | Admitting: Nurse Practitioner

## 2017-04-09 ENCOUNTER — Ambulatory Visit: Payer: BLUE CROSS/BLUE SHIELD | Admitting: Cardiology

## 2017-04-09 ENCOUNTER — Ambulatory Visit: Payer: BLUE CROSS/BLUE SHIELD | Admitting: Nurse Practitioner

## 2017-05-20 ENCOUNTER — Ambulatory Visit: Payer: BLUE CROSS/BLUE SHIELD | Admitting: Nurse Practitioner

## 2017-06-12 ENCOUNTER — Other Ambulatory Visit (HOSPITAL_COMMUNITY)
Admission: RE | Admit: 2017-06-12 | Discharge: 2017-06-12 | Disposition: A | Discharge status: Home / Self Care | Payer: BLUE CROSS/BLUE SHIELD | Source: Ambulatory Visit | Attending: Obstetrics & Gynecology | Admitting: Obstetrics & Gynecology

## 2017-06-12 ENCOUNTER — Encounter: Payer: Self-pay | Admitting: Certified Nurse Midwife

## 2017-06-12 ENCOUNTER — Ambulatory Visit: Payer: BLUE CROSS/BLUE SHIELD | Admitting: Certified Nurse Midwife

## 2017-06-12 VITALS — BP 108/68 | HR 70 | Resp 16 | Ht 66.5 in

## 2017-06-12 DIAGNOSIS — Z124 Encounter for screening for malignant neoplasm of cervix: Secondary | ICD-10-CM | POA: Diagnosis not present

## 2017-06-12 DIAGNOSIS — Z01419 Encounter for gynecological examination (general) (routine) without abnormal findings: Secondary | ICD-10-CM | POA: Insufficient documentation

## 2017-06-12 NOTE — Patient Instructions (Signed)

## 2017-06-12 NOTE — Progress Notes (Signed)
56 y.o. [id number] Married  Caucasian Fe here for annual exam. Menopausal no HRT. Denies vaginal bleeding and treats vaginal dryness with Olive Oil. Sees Dr. Inda Merlin PCP for aex and labs. Also manages, hypertension, cholesterol, anxiety, all stable per patient. Working on weight loss and increase walking. No health issues today.  Patient's last menstrual period was 03/17/2013 (exact date).          Sexually active: Yes.    The current method of family planning is vasectomy.    Exercising: Yes.    walking Smoker:  no  Health Maintenance: Pap:  03-22-15 neg HPV HR neg requests pap smear. History of Abnormal Pap: no MMG:  07-30-16 category c density birads 1:neg with 3 D Self Breast exams: occ Colonoscopy:  2015 normal f/u 36yrs BMD:   none TDaP:  2017 Shingles: no Pneumonia: no Hep C and HIV: both neg 2017 Labs: with PCP   reports that she has never smoked. She has never used smokeless tobacco. She reports that she drinks about 2.4 oz of alcohol per week . She reports that she does not use drugs.  Past Medical History:  Diagnosis Date  . Allergic rhinitis   . Anxiety   . Asthma   . Depression    2005  . Dyslipidemia   . Edema in pregnancy   . Exercise induced bronchospasm   . HSV-2 (herpes simplex virus 2) infection   . Hypertension   . Hypophysitis ([place])    H/O Autoimmune  . Mitral regurgitation   . Palpitations   . Panic attack   . Perennial allergic rhinitis   . PIH (pregnancy induced hypertension)    P partum// severe   . Tonsillar calculus 05/2012   Dr Orpah Greek bates    Past Surgical History:  Procedure Laterality Date  . ABLATION  2007   HTA  . CESAREAN SECTION     x 2// Due to HSV2  . FOOT SURGERY    . plastic eye surgery      Current Outpatient Prescriptions  Medication Sig Dispense Refill  . albuterol (PROVENTIL HFA;VENTOLIN HFA) 108 (90 BASE) MCG/ACT inhaler Inhale 2 puffs into the lungs every 4 (four) hours as needed for wheezing or shortness of breath.      Marland Kitchen aspirin 81 MG tablet Take 81 mg by mouth daily.    . Clindamycin-Benzoyl Per, Refr, gel Reported on 11/17/2015  6  . clonazePAM (KLONOPIN) 1 MG tablet Take 1 mg by mouth daily.    . metoprolol tartrate (LOPRESSOR) 25 MG tablet Take 0.5 tablets (12.5 mg total) by mouth daily as needed. 45 tablet 3  . olopatadine (PATANOL) 0.1 % ophthalmic solution APPLY 1 DROP INTO BOTH EYES TWICE A DAY  3  . QVAR 80 MCG/ACT inhaler Inhale 2 puffs into the lungs as needed.   5  . rosuvastatin (CRESTOR) 10 MG tablet Take 5 mg by mouth daily.    . sertraline (ZOLOFT) 50 MG tablet take 1/2 daily  1  . tretinoin (RETIN-A) 0.05 % cream   9  . triamcinolone cream (KENALOG) 0.1 % Apply 1 application topically as needed. Reported on 11/17/2015    . valsartan (DIOVAN) 160 MG tablet Take 80 mg by mouth daily.     No current facility-administered medications for this visit.     Family History  Problem Relation Age of Onset  . Prostate cancer Father   . Heart disease Father   . Hyperlipidemia Brother   . Heart disease Mother   .  Heart disease Maternal Grandfather   . Heart disease Brother   . Hyperlipidemia Brother   . Heart disease Brother   . Hyperlipidemia Brother   . Hyperlipidemia Brother   . Hyperlipidemia Brother     ROS:  Pertinent items are noted in HPI.  Otherwise, a comprehensive ROS was negative.  Exam:   BP 108/68   Pulse 70   Resp 16   Ht 5' 6.5" (1.689 m)   LMP 03/17/2013 (Exact Date)  Height: 5' 6.5" (168.9 cm) Ht Readings from Last 3 Encounters:  06/12/17 5' 6.5" (1.689 m)  04/04/16 5\' 7"  (1.702 m)  03/27/16 5' 6.25" (1.683 m)    General appearance: alert, cooperative and appears stated age Head: Normocephalic, without obvious abnormality, atraumatic Neck: no adenopathy, supple, symmetrical, trachea midline and thyroid normal to inspection and palpation Lungs: clear to auscultation bilaterally Breasts: normal appearance, no masses or tenderness, No nipple retraction or dimpling,  No nipple discharge or bleeding, No axillary or supraclavicular adenopathy, with lift scarring noted Heart: regular rate and rhythm Abdomen: soft, non-tender; no masses,  no organomegaly Extremities: extremities normal, atraumatic, no cyanosis or edema Skin: Skin color, texture, turgor normal. No rashes or lesions Lymph nodes: Cervical, supraclavicular, and axillary nodes normal. No abnormal inguinal nodes palpated Neurologic: Grossly normal   Pelvic: External genitalia:  no lesions              Urethra:  normal appearing urethra with no masses, tenderness or lesions              Bartholin's and Skene's: normal                 Vagina: normal appearing vagina with normal color and discharge, no lesions              Cervix: normal appearance, no masses or tenderness, No nipple retraction or dimpling, No nipple discharge or bleeding, No axillary or supraclavicular adenopathy, breast lift scars              Pap taken: Yes.   Bimanual Exam:  Uterus:  normal size, contour, position, consistency, mobility, non-tender              Adnexa: normal adnexa and no mass, fullness, tenderness               Rectovaginal: Confirms               Anus:  normal sphincter tone, no lesions  Chaperone present: yes  A:  Well Woman with normal exam  Menopausal no HRT  Vaginal dryness using Olive oil with good response  Hypertension, cholesterol ,anxiety, asthma with PCP management  P:   Reviewed health and wellness pertinent to exam  Aware of need to advise if vaginal bleeding  Will advise if need to change use of Olive Oil  Continue follow up with PCP as indicated  Pap smear: yes   counseled on breast self exam, mammography screening, feminine hygiene, adequate intake of calcium and vitamin D, diet and exercise  return annually or prn  An After Visit Summary was printed and given to the patient.

## 2017-06-13 LAB — CYTOLOGY - PAP: Diagnosis: NEGATIVE

## 2017-07-04 ENCOUNTER — Ambulatory Visit (INDEPENDENT_AMBULATORY_CARE_PROVIDER_SITE_OTHER): Payer: BLUE CROSS/BLUE SHIELD | Admitting: Cardiology

## 2017-07-04 ENCOUNTER — Encounter: Payer: Self-pay | Admitting: Cardiology

## 2017-07-04 VITALS — BP 112/72 | HR 59 | Ht 66.5 in | Wt 190.0 lb

## 2017-07-04 DIAGNOSIS — Z8249 Family history of ischemic heart disease and other diseases of the circulatory system: Secondary | ICD-10-CM

## 2017-07-04 DIAGNOSIS — I10 Essential (primary) hypertension: Secondary | ICD-10-CM | POA: Diagnosis not present

## 2017-07-04 DIAGNOSIS — R002 Palpitations: Secondary | ICD-10-CM | POA: Diagnosis not present

## 2017-07-04 DIAGNOSIS — I340 Nonrheumatic mitral (valve) insufficiency: Secondary | ICD-10-CM

## 2017-07-04 HISTORY — DX: Family history of ischemic heart disease and other diseases of the circulatory system: Z82.49

## 2017-07-04 NOTE — Patient Instructions (Signed)
Medication Instructions:  Your provider recommends that you continue on your current medications as directed. Please refer to the Current Medication list given to you today.    Labwork: None  Testing/Procedures: Your provider has requested that you have an echocardiogram. Echocardiography is a painless test that uses sound waves to create images of your heart. It provides your doctor with information about the size and shape of your heart and how well your heart's chambers and valves are working. This procedure takes approximately one hour. There are no restrictions for this procedure.    Dr. Radford Pax recommends you have a CALCIUM SCORE.  Follow-Up: Your provider wants you to follow-up in: 1 year with Dr. Radford Pax. You will receive a reminder letter in the mail two months in advance. If you don't receive a letter, please call our office to schedule the follow-up appointment.    Any Other Special Instructions Will Be Listed Below (If Applicable).     If you need a refill on your cardiac medications before your next appointment, please call your pharmacy.

## 2017-07-04 NOTE — Progress Notes (Signed)
Cardiology Office Note:    Date:  07/04/2017   ID:  Vickie Perry, DOB 08-04-1961, MRN [id number]  PCP:  Vickie Huddle, MD  Cardiologist:  Vickie Him, MD   Referring MD: Vickie Huddle, MD   Chief Complaint  Patient presents with  . Hypertension  . Mitral Regurgitation    History of Present Illness:    Vickie Perry is a 56 y.o. female with a hx of mild MR/AR/PR, HTN and history of palpitations with no significant arrhythmias on heart monitor.  She is here today for followup and is doing well.  She denies any chest pain or pressure, SOB, DOE, PND, orthopnea, LE edema, dizziness or syncope. Rarely she will notice some skipped beats that are not bothersome. She walks for exercise and plans to start working out at the gym.   Past Medical History:  Diagnosis Date  . Allergic rhinitis   . Anxiety   . Asthma   . Depression    2005  . Dyslipidemia   . Edema in pregnancy   . Exercise induced bronchospasm   . Family history of early CAD 07/04/2017  . HSV-2 (herpes simplex virus 2) infection   . Hypertension   . Hypophysitis ([place])    H/O Autoimmune  . Mitral regurgitation   . Palpitations   . Panic attack   . Perennial allergic rhinitis   . PIH (pregnancy induced hypertension)    P partum// severe   . Tonsillar calculus 05/2012   Dr Orpah Greek bates    Past Surgical History:  Procedure Laterality Date  . ABLATION  2007   HTA  . CESAREAN SECTION     x 2// Due to HSV2  . FOOT SURGERY    . plastic eye surgery      Current Medications: Current Meds  Medication Sig  . albuterol (PROVENTIL HFA;VENTOLIN HFA) 108 (90 BASE) MCG/ACT inhaler Inhale 2 puffs into the lungs every 4 (four) hours as needed for wheezing or shortness of breath.   Marland Kitchen aspirin 81 MG tablet Take 81 mg by mouth daily.  . Clindamycin-Benzoyl Per, Refr, gel Reported on 11/17/2015  . clonazePAM (KLONOPIN) 1 MG tablet Take 1 mg by mouth daily.  . metoprolol tartrate (LOPRESSOR) 25 MG tablet Take  0.5 tablets (12.5 mg total) by mouth daily as needed.  Marland Kitchen QVAR 80 MCG/ACT inhaler Inhale 2 puffs into the lungs as needed.   . rosuvastatin (CRESTOR) 10 MG tablet Take 5 mg by mouth daily.  . sertraline (ZOLOFT) 50 MG tablet take 1/2 daily  . tretinoin (RETIN-A) 0.05 % cream   . triamcinolone cream (KENALOG) 0.1 % Apply 1 application topically as needed. Reported on 11/17/2015  . valsartan (DIOVAN) 160 MG tablet Take 80 mg by mouth daily.     Allergies:   Erythromycin   Social History   Social History  . Marital status: Married    Spouse name: N/A  . Number of children: N/A  . Years of education: N/A   Social History Main Topics  . Smoking status: Never Smoker  . Smokeless tobacco: Never Used  . Alcohol use 2.4 oz/week    4 Standard drinks or equivalent per week  . Drug use: No  . Sexual activity: Yes    Birth control/ protection: Other-see comments     Comment: vasectomy   Other Topics Concern  . None   Social History Narrative  . None     Family History: The patient's family history includes Heart disease in her  brother, brother, father, maternal grandfather, and mother; Hyperlipidemia in her brother, brother, brother, brother, and brother; Prostate cancer in her father.  ROS:   Please see the history of present illness.     All other systems reviewed and are negative.  EKGs/Labs/Other Studies Reviewed:    The following studies were reviewed today: none  EKG:  EKG is  ordered today.  The ekg ordered today demonstrates sinus bradycardia at 59bpm with no ST changes  Recent Labs: No results found for requested labs within last 8760 hours.   Recent Lipid Panel    Component Value Date/Time   CHOL 172 03/27/2016 1535   TRIG 164 (H) 03/27/2016 1535   HDL 58 03/27/2016 1535   CHOLHDL 3.0 03/27/2016 1535   VLDL 33 (H) 03/27/2016 1535   LDLCALC 81 03/27/2016 1535    Physical Exam:    VS:  BP 112/72   Pulse (!) 59   Ht 5' 6.5" (1.689 m)   Wt 190 lb (86.2 kg)  Comment: Pt stated weight, refused to weigh on scale  LMP 03/17/2013 (Exact Date)   BMI 30.21 kg/m     Wt Readings from Last 3 Encounters:  07/04/17 190 lb (86.2 kg)  09/12/15 180 lb (81.6 kg)  08/29/15 180 lb (81.6 kg)     GEN:  Well nourished, well developed in no acute distress HEENT: Normal NECK: No JVD; No carotid bruits LYMPHATICS: No lymphadenopathy CARDIAC: RRR, no murmurs, rubs, gallops RESPIRATORY:  Clear to auscultation without rales, wheezing or rhonchi  ABDOMEN: Soft, non-tender, non-distended MUSCULOSKELETAL:  No edema; No deformity  SKIN: Warm and dry NEUROLOGIC:  Alert and oriented x 3 PSYCHIATRIC:  Normal affect   ASSESSMENT:    1. Benign essential HTN   2. Non-rheumatic mitral regurgitation   3. Palpitations   4. Family history of early CAD    PLAN:    In order of problems listed above:  1. HTN - her BP is well controlled on current meds.  She will continue on Valsartan.  She checked with her pharmacy and her Valsartan was not affected and she has enough to last until the backorder of new shipment gets in.  2. Mild MR by echo. I will repeat a 2D echo since she has not had an echo in 3 years.  3. Palpitations - these are well controlled.  She will continue on PRN BB for breakthrough.   4. Family history of CAD at an early age.  I will get a coronary calcium score to assess her future risk.    Medication Adjustments/Labs and Tests Ordered: Current medicines are reviewed at length with the patient today.  Concerns regarding medicines are outlined above.  No orders of the defined types were placed in this encounter.  No orders of the defined types were placed in this encounter.   Signed, Vickie Him, MD  07/04/2017 9:52 AM    [facility]

## 2017-07-05 ENCOUNTER — Other Ambulatory Visit: Payer: Self-pay

## 2017-07-05 ENCOUNTER — Ambulatory Visit (HOSPITAL_COMMUNITY): Payer: BLUE CROSS/BLUE SHIELD | Attending: Cardiovascular Disease

## 2017-07-05 ENCOUNTER — Ambulatory Visit (INDEPENDENT_AMBULATORY_CARE_PROVIDER_SITE_OTHER)
Admission: RE | Admit: 2017-07-05 | Discharge: 2017-07-05 | Disposition: A | Discharge status: Home / Self Care | Payer: Self-pay | Source: Ambulatory Visit | Attending: Cardiology | Admitting: Cardiology

## 2017-07-05 DIAGNOSIS — I340 Nonrheumatic mitral (valve) insufficiency: Secondary | ICD-10-CM

## 2017-07-05 DIAGNOSIS — J45909 Unspecified asthma, uncomplicated: Secondary | ICD-10-CM | POA: Insufficient documentation

## 2017-07-05 DIAGNOSIS — Z8249 Family history of ischemic heart disease and other diseases of the circulatory system: Secondary | ICD-10-CM

## 2017-07-05 DIAGNOSIS — R002 Palpitations: Secondary | ICD-10-CM | POA: Insufficient documentation

## 2017-07-05 DIAGNOSIS — I10 Essential (primary) hypertension: Secondary | ICD-10-CM | POA: Insufficient documentation

## 2017-07-05 DIAGNOSIS — E785 Hyperlipidemia, unspecified: Secondary | ICD-10-CM | POA: Diagnosis not present

## 2017-09-09 ENCOUNTER — Telehealth: Payer: Self-pay | Admitting: Cardiology

## 2017-09-09 DIAGNOSIS — R002 Palpitations: Secondary | ICD-10-CM

## 2017-09-09 NOTE — Telephone Encounter (Signed)
Vickie Perry is calling because she has been having palpitations and irregular heart beats for about a week . Please call

## 2017-09-09 NOTE — Telephone Encounter (Signed)
Event monitor scheduled tomorrow morning at 0900. Patient was grateful for call and agrees with treatment plan.

## 2017-09-09 NOTE — Telephone Encounter (Signed)
Please get an event monitor

## 2017-09-09 NOTE — Telephone Encounter (Signed)
Patient reports palpitations for 1 week. She states she has always had seldom "skipped beats," but now it feels like it happens every 3 to 10 beats.  She has no other symptoms and denies CP, SOB. She does not know exact heart rate but says it is not racing. She has continued to exercise with no problems. She reports she has tried to avoid caffeine.  She has been under a lot more stress recently than usual.  She is leaving to go to [place] on Thursday and is very concerned. She is anxious and wants to make sure she is OK to go.  She understands she will be called with Dr. Theodosia Blender recommendations.

## 2017-09-10 ENCOUNTER — Ambulatory Visit (INDEPENDENT_AMBULATORY_CARE_PROVIDER_SITE_OTHER): Payer: BLUE CROSS/BLUE SHIELD

## 2017-09-10 DIAGNOSIS — R002 Palpitations: Secondary | ICD-10-CM

## 2017-11-05 HISTORY — PX: KNEE SURGERY: SHX244

## 2018-01-27 DIAGNOSIS — M25562 Pain in left knee: Secondary | ICD-10-CM | POA: Insufficient documentation

## 2018-01-29 DIAGNOSIS — M25519 Pain in unspecified shoulder: Secondary | ICD-10-CM | POA: Insufficient documentation

## 2018-05-12 ENCOUNTER — Encounter: Payer: Self-pay | Admitting: Cardiology

## 2018-06-26 ENCOUNTER — Ambulatory Visit: Payer: BLUE CROSS/BLUE SHIELD | Admitting: Cardiology

## 2018-07-16 ENCOUNTER — Ambulatory Visit: Payer: BLUE CROSS/BLUE SHIELD | Admitting: Certified Nurse Midwife

## 2018-07-17 ENCOUNTER — Telehealth: Payer: Self-pay | Admitting: Cardiology

## 2018-07-17 NOTE — Telephone Encounter (Signed)
Advised the patient that we did not have an appointment slot for just an EKG. Advised her to see primary care provider instead. The patient is getting a laser treatment at [facility].

## 2018-07-17 NOTE — Telephone Encounter (Signed)
° °  Patient calling to request order for  EKG , states she needs EKG done asap.  Patient states she anticipated getting it done 8/22 (bumplist). Patient states she needs EKG to provide to another physician Please call, ok to leave detailed message

## 2018-07-23 ENCOUNTER — Other Ambulatory Visit: Payer: Self-pay

## 2018-07-23 ENCOUNTER — Ambulatory Visit: Payer: BLUE CROSS/BLUE SHIELD | Admitting: Certified Nurse Midwife

## 2018-07-23 ENCOUNTER — Other Ambulatory Visit (HOSPITAL_COMMUNITY)
Admission: RE | Admit: 2018-07-23 | Discharge: 2018-07-23 | Disposition: A | Discharge status: Home / Self Care | Payer: BLUE CROSS/BLUE SHIELD | Source: Ambulatory Visit | Attending: Certified Nurse Midwife | Admitting: Certified Nurse Midwife

## 2018-07-23 ENCOUNTER — Encounter: Payer: Self-pay | Admitting: Certified Nurse Midwife

## 2018-07-23 VITALS — BP 118/62 | HR 70 | Resp 16 | Ht 66.25 in

## 2018-07-23 DIAGNOSIS — R899 Unspecified abnormal finding in specimens from other organs, systems and tissues: Secondary | ICD-10-CM | POA: Diagnosis not present

## 2018-07-23 DIAGNOSIS — R590 Localized enlarged lymph nodes: Secondary | ICD-10-CM

## 2018-07-23 DIAGNOSIS — N951 Menopausal and female climacteric states: Secondary | ICD-10-CM

## 2018-07-23 DIAGNOSIS — Z01411 Encounter for gynecological examination (general) (routine) with abnormal findings: Secondary | ICD-10-CM

## 2018-07-23 DIAGNOSIS — Z124 Encounter for screening for malignant neoplasm of cervix: Secondary | ICD-10-CM

## 2018-07-23 NOTE — Progress Notes (Signed)
57 y.o. [id number] Married  Caucasian Fe here for annual exam. Menopausal no HRT. Denies vaginal bleeding or vaginal dryness. Still having hot flashes/night sweats but tolerating well. Sees Dr.Gates yearly for asthma, cholesterol, hypertension, depression medication management. All medications stable at present. Some night urination, but only 1-2 times nightly. Has noted ? Enlarged lymph nodes in neck area over the past 3 weeks. Denies sore throat or viral issues or fever.   Works out frequently also. No other health issues today.  Patient's last menstrual period was 03/17/2013 (exact date).          Sexually active: Yes.    The current method of family planning is vasectomy.    Exercising: Yes.    walking, eliptical & weights Smoker:  no  Review of Systems  Constitutional: Negative.   HENT: Negative.   Eyes: Negative.   Respiratory: Negative.   Cardiovascular: Negative.   Gastrointestinal: Negative.   Genitourinary: Negative.   Musculoskeletal: Negative.   Skin:       Hair loss  Neurological: Negative.   Endo/Heme/Allergies: Negative.   Psychiatric/Behavioral: Negative.     Health Maintenance: Pap:  03-22-15 neg HPV HR  Neg, 06-12-17 neg History of Abnormal Pap: no MMG:  Waiting on fax for 2018 Self Breast exams: no Colonoscopy:  2015 f/u 72yrs BMD:   none TDaP:  2017 Shingles: not done Pneumonia: not done Hep C and HIV: both neg 2017 Labs: if needed, with PCP.   reports that she has never smoked. She has never used smokeless tobacco. She reports that she drank alcohol. She reports that she does not use drugs.  Past Medical History:  Diagnosis Date  . Allergic rhinitis   . Anxiety   . Asthma   . Depression    2005  . Dyslipidemia   . Edema in pregnancy   . Exercise induced bronchospasm   . Family history of early CAD 07/04/2017  . HSV-2 (herpes simplex virus 2) infection   . Hypertension   . Hypophysitis ([place])    H/O Autoimmune  . Mitral regurgitation   . Palpitations    . Panic attack   . Perennial allergic rhinitis   . PIH (pregnancy induced hypertension)    P partum// severe   . Tonsillar calculus 05/2012   Dr Orpah Greek bates  . Torn meniscus     Past Surgical History:  Procedure Laterality Date  . ABLATION  2007   HTA  . CESAREAN SECTION     x 2// Due to HSV2  . FOOT SURGERY    . KNEE SURGERY Left 2019  . plastic eye surgery      Current Outpatient Medications  Medication Sig Dispense Refill  . albuterol (PROVENTIL HFA;VENTOLIN HFA) 108 (90 BASE) MCG/ACT inhaler Inhale 2 puffs into the lungs every 4 (four) hours as needed for wheezing or shortness of breath.     Marland Kitchen aspirin 81 MG tablet Take 81 mg by mouth daily.    . Clindamycin-Benzoyl Per, Refr, gel Reported on 11/17/2015  6  . clonazePAM (KLONOPIN) 1 MG tablet Take 1 mg by mouth daily.    . phentermine (ADIPEX-P) 37.5 MG tablet Take 37.5 mg by mouth daily.  1  . QVAR 80 MCG/ACT inhaler Inhale 2 puffs into the lungs as needed.   5  . rosuvastatin (CRESTOR) 10 MG tablet Take 5 mg by mouth daily.    . sertraline (ZOLOFT) 50 MG tablet take 1/2 daily  1  . tretinoin (RETIN-A) 0.05 % cream  9  . triamcinolone cream (KENALOG) 0.1 % Apply 1 application topically as needed. Reported on 11/17/2015    . valsartan (DIOVAN) 160 MG tablet Take 80 mg by mouth daily.     No current facility-administered medications for this visit.     Family History  Problem Relation Age of Onset  . Prostate cancer Father   . Heart disease Father   . Hyperlipidemia Brother   . Heart disease Mother   . Heart disease Maternal Grandfather   . Heart disease Brother   . Hyperlipidemia Brother   . Heart disease Brother   . Hyperlipidemia Brother   . Hyperlipidemia Brother   . Hyperlipidemia Brother     ROS:  Pertinent items are noted in HPI.  Otherwise, a comprehensive ROS was negative.  Exam:   BP 118/62   Pulse 70   Resp 16   Ht 5' 6.25" (1.683 m)   LMP 03/17/2013 (Exact Date)   BMI 30.44 kg/m  Height: 5'  6.25" (168.3 cm) Ht Readings from Last 3 Encounters:  07/23/18 5' 6.25" (1.683 m)  07/04/17 5' 6.5" (1.689 m)  06/12/17 5' 6.5" (1.689 m)    General appearance: alert, cooperative and appears stated age Head: Normocephalic, without obvious abnormality, atraumatic Neck: Cervical and submandibular lymph nodes noted slightly enlarged, not tender, supple, symmetrical, trachea midline and thyroid normal to inspection and palpation Lungs: clear to auscultation bilaterally Breasts: normal appearance, no masses or tenderness, No nipple retraction or dimpling, No nipple discharge or bleeding, No axillary or supraclavicular adenopathy, reduction scarring bilateral Heart: regular rate and rhythm Abdomen: soft, non-tender; no masses,  no organomegaly Extremities: extremities normal, atraumatic, no cyanosis or edema Skin: Skin color, texture, turgor normal. No rashes or lesions Lymph nodes: Cervical, supraclavicular, and axillary nodes normal. No abnormal inguinal nodes palpated Neurologic: Grossly normal   Pelvic: External genitalia:  no lesions,normal appearance              Urethra:  normal appearing urethra with no masses, tenderness or lesions              Bartholin's and Skene's: normal                 Vagina: normal appearing vagina with normal color and discharge, no lesions              Cervix: no bleeding following Pap, no cervical motion tenderness and no lesions              Pap taken: Yes.   Bimanual Exam:  Uterus:  normal size, contour, position, consistency, mobility, non-tender and anteverted              Adnexa: normal adnexa and no mass, fullness, tenderness               Rectovaginal: Confirms               Anus:  normal sphincter tone, no lesions  Chaperone present: yes  A:  Well Woman with normal exam  Menopausal symptoms, no HRT  Enlarged neck lymph nodes  Hypertension,cholesterol, depression and asthma management per PCP  Screening labs     P:   Reviewed health and  wellness pertinent to exam  Discussed comfort measures for hot flashes and also discussed she is on Clonazepam which is also prescribed for hot flash relief when estrogen is not used. Discussed if having at night, may want to adjust time of day she takes this medication. Questions addressed.  Discussed finding and ?etiology of viral, sinus or throat related. Will do Lab to make sure no other issues. If normal will recheck in 2 weeks  Lab: CBC  Continue follow up with PCP as indicated.  Hgb A1-C per patient request  Pap smear: yes   counseled on breast self exam, mammography screening, feminine hygiene, menopause, adequate intake of calcium and vitamin D, diet and exercise  return annually or prn  An After Visit Summary was printed and given to the patient.

## 2018-07-23 NOTE — Patient Instructions (Signed)

## 2018-07-24 LAB — CBC WITH DIFFERENTIAL/PLATELET
Basophils Absolute: 0.1 10*3/uL (ref 0.0–0.2)
Basos: 1 %
EOS (ABSOLUTE): 0.2 10*3/uL (ref 0.0–0.4)
Eos: 2 %
Hematocrit: 37.6 % (ref 34.0–46.6)
Hemoglobin: 12.7 g/dL (ref 11.1–15.9)
Immature Grans (Abs): 0 10*3/uL (ref 0.0–0.1)
Immature Granulocytes: 0 %
Lymphocytes Absolute: 4.8 10*3/uL — ABNORMAL HIGH (ref 0.7–3.1)
Lymphs: 66 %
MCH: 28.7 pg (ref 26.6–33.0)
MCHC: 33.8 g/dL (ref 31.5–35.7)
MCV: 85 fL (ref 79–97)
Monocytes Absolute: 0.7 10*3/uL (ref 0.1–0.9)
Monocytes: 10 %
Neutrophils Absolute: 1.5 10*3/uL (ref 1.4–7.0)
Neutrophils: 21 %
Platelets: 243 10*3/uL (ref 150–450)
RBC: 4.43 x10E6/uL (ref 3.77–5.28)
RDW: 13.6 % (ref 12.3–15.4)
WBC: 7.3 10*3/uL (ref 3.4–10.8)

## 2018-07-24 LAB — HEMOGLOBIN A1C
Est. average glucose Bld gHb Est-mCnc: 120 mg/dL
Hgb A1c MFr Bld: 5.8 % — ABNORMAL HIGH (ref 4.8–5.6)

## 2018-07-25 LAB — CYTOLOGY - PAP
Diagnosis: NEGATIVE
HPV: NOT DETECTED

## 2018-07-29 ENCOUNTER — Telehealth: Payer: Self-pay | Admitting: *Deleted

## 2018-07-29 NOTE — Telephone Encounter (Addendum)
Notes recorded by Burnice Logan, RN on 07/29/2018 at 10:41 AM EDT Left message to call Sharee Pimple at [phone].   Notes recorded by Regina Eck, CNM on 07/26/2018 at 10:36 AM EDT Notify patient her CBC is normal, but showed some atypical lymphocytes and slight elevation. She slightly enlarged lymph nodes in neck. She needs to follow up with her CBC and lymph nodes with PCP. She will need copy of labs Hgb A1-C is still elevated in pre diabetes range at 5.8, but 6.2 2 years ago Pap smear is negative HPV not detected, atrophy noted 02

## 2018-07-29 NOTE — Telephone Encounter (Signed)
Spoke with patient, advised as seen below per Melvia Heaps, CNM.   1. Copy of labs dated 07/23/18 faxed via Epic to Dr. Inda Merlin, patient will call to schedule.   2. Update for  Melvia Heaps, CNM on Vitamin D. Taking 2,000 IU daily.  Next AEX 07/30/19  Patient Patient verbalizes understanding and is agreeable. Routing to [employer], CNM. Encounter closed.

## 2018-08-04 ENCOUNTER — Encounter: Payer: Self-pay | Admitting: Certified Nurse Midwife

## 2018-08-17 NOTE — Progress Notes (Signed)
Cardiology Office Note:    Date:  08/18/2018   ID:  Vickie Perry, DOB Oct 26, 1961, MRN [id number]  PCP:  Josetta Huddle, MD  Cardiologist:  No primary care provider on file.    Referring MD: Josetta Huddle, MD   Chief Complaint  Patient presents with  . Hypertension  . Mitral Regurgitation    History of Present Illness:    Vickie Perry is a 57 y.o. female with a hx of mild MR/AR/PR, HTN, family history of CAD with coronary calcium score of 0 and history of palpitations with no significant arrhythmias on heart monitor.  She is here today for followup and is doing well.  She denies any chest pain or pressure, SOB, DOE, PND, orthopnea, LE edema, dizziness, palpitations or syncope. She is compliant with her meds and is tolerating meds with no SE.    Past Medical History:  Diagnosis Date  . Allergic rhinitis   . Anxiety   . Asthma   . Depression    2005  . Dyslipidemia   . Edema in pregnancy   . Exercise induced bronchospasm   . Family history of early CAD 07/04/2017  . HSV-2 (herpes simplex virus 2) infection   . Hypertension   . Hypophysitis ([place])    H/O Autoimmune  . Mitral regurgitation   . Palpitations   . Panic attack   . Perennial allergic rhinitis   . PIH (pregnancy induced hypertension)    P partum// severe   . Tonsillar calculus 05/2012   Dr Orpah Greek bates  . Torn meniscus     Past Surgical History:  Procedure Laterality Date  . ABLATION  2007   HTA  . CESAREAN SECTION     x 2// Due to HSV2  . FOOT SURGERY    . KNEE SURGERY Left 2019  . plastic eye surgery      Current Medications: Current Meds  Medication Sig  . albuterol (PROVENTIL HFA;VENTOLIN HFA) 108 (90 BASE) MCG/ACT inhaler Inhale 2 puffs into the lungs every 4 (four) hours as needed for wheezing or shortness of breath.   Marland Kitchen aspirin 81 MG tablet Take 81 mg by mouth daily.  . Clindamycin-Benzoyl Per, Refr, gel Reported on 11/17/2015  . clonazePAM (KLONOPIN) 1 MG tablet Take 1 mg  by mouth daily.  . phentermine (ADIPEX-P) 37.5 MG tablet Take 37.5 mg by mouth daily.  Marland Kitchen QVAR 80 MCG/ACT inhaler Inhale 2 puffs into the lungs as needed.   . rosuvastatin (CRESTOR) 10 MG tablet Take 5 mg by mouth daily.  . sertraline (ZOLOFT) 50 MG tablet take 1/2 daily  . tretinoin (RETIN-A) 0.05 % cream   . triamcinolone cream (KENALOG) 0.1 % Apply 1 application topically as needed. Reported on 11/17/2015  . valsartan (DIOVAN) 160 MG tablet Take 80 mg by mouth daily.     Allergies:   Erythromycin and Erythromycin base   Social History   Socioeconomic History  . Marital status: Married    Spouse name: Not on file  . Number of children: Not on file  . Years of education: Not on file  . Highest education level: Not on file  Occupational History  . Not on file  Social Needs  . Financial resource strain: Not on file  . Food insecurity:    Worry: Not on file    Inability: Not on file  . Transportation needs:    Medical: Not on file    Non-medical: Not on file  Tobacco Use  . Smoking status:  Never Smoker  . Smokeless tobacco: Never Used  Substance and Sexual Activity  . Alcohol use: Not Currently  . Drug use: No  . Sexual activity: Yes    Partners: Male    Birth control/protection: Other-see comments    Comment: vasectomy  Lifestyle  . Physical activity:    Days per week: Not on file    Minutes per session: Not on file  . Stress: Not on file  Relationships  . Social connections:    Talks on phone: Not on file    Gets together: Not on file    Attends religious service: Not on file    Active member of club or organization: Not on file    Attends meetings of clubs or organizations: Not on file    Relationship status: Not on file  Other Topics Concern  . Not on file  Social History Narrative  . Not on file     Family History: The patient's family history includes Heart disease in her brother, brother, father, maternal grandfather, and mother; Hyperlipidemia in her  brother, brother, brother, brother, and brother; Prostate cancer in her father.  ROS:   Please see the history of present illness.    ROS  All other systems reviewed and negative.   EKGs/Labs/Other Studies Reviewed:    The following studies were reviewed today: none  EKG:  EKG is ordered today and showed NSR at 64 bpm with no ST changes   Recent Labs: 07/23/2018: Hemoglobin 12.7; Platelets 243   Recent Lipid Panel    Component Value Date/Time   CHOL 172 03/27/2016 1535   TRIG 164 (H) 03/27/2016 1535   HDL 58 03/27/2016 1535   CHOLHDL 3.0 03/27/2016 1535   VLDL 33 (H) 03/27/2016 1535   LDLCALC 81 03/27/2016 1535    Physical Exam:    VS:  BP 104/65   Pulse 87   Ht 5' 6.25" (1.683 m)   Wt 197 lb (89.4 kg) Comment: Pt refused to weigh-pt stated weight  LMP 03/17/2013 (Exact Date)   BMI 31.56 kg/m     Wt Readings from Last 3 Encounters:  08/18/18 197 lb (89.4 kg)  07/04/17 190 lb (86.2 kg)  09/12/15 180 lb (81.6 kg)     GEN:  Well nourished, well developed in no acute distress HEENT: Normal NECK: No JVD; No carotid bruits LYMPHATICS: No lymphadenopathy CARDIAC: RRR, no murmurs, rubs, gallops RESPIRATORY:  Clear to auscultation without rales, wheezing or rhonchi  ABDOMEN: Soft, non-tender, non-distended MUSCULOSKELETAL:  No edema; No deformity  SKIN: Warm and dry NEUROLOGIC:  Alert and oriented x 3 PSYCHIATRIC:  Normal affect   ASSESSMENT: ast   1. Nonrheumatic mitral valve regurgitation   2. Benign essential HTN   3. Palpitations    PLAN:    In order of problems listed above:  1.  Mild MR - trivial MR on echo a year ago   2.  HTN - BP is well controlled on exam today.  She will continue on Valsartan 160mg  daily.    3.  Palpitations - she has not had any palpitations since I saw her last and has not had to use an BB for breakthrough.     Medication Adjustments/Labs and Tests Ordered: Current medicines are reviewed at length with the patient today.   Concerns regarding medicines are outlined above.  Orders Placed This Encounter  Procedures  . EKG 12-Lead   No orders of the defined types were placed in this encounter.   Signed,  Fransico Him, MD  08/18/2018 10:34 AM    [place]

## 2018-08-18 ENCOUNTER — Ambulatory Visit: Payer: BLUE CROSS/BLUE SHIELD | Admitting: Cardiology

## 2018-08-18 ENCOUNTER — Encounter: Payer: Self-pay | Admitting: Cardiology

## 2018-08-18 VITALS — BP 104/65 | HR 87 | Ht 66.25 in | Wt 197.0 lb

## 2018-08-18 DIAGNOSIS — I10 Essential (primary) hypertension: Secondary | ICD-10-CM | POA: Diagnosis not present

## 2018-08-18 DIAGNOSIS — I340 Nonrheumatic mitral (valve) insufficiency: Secondary | ICD-10-CM | POA: Diagnosis not present

## 2018-08-18 DIAGNOSIS — R002 Palpitations: Secondary | ICD-10-CM | POA: Diagnosis not present

## 2018-08-18 NOTE — Patient Instructions (Signed)

## 2018-08-25 ENCOUNTER — Other Ambulatory Visit: Payer: Self-pay | Admitting: *Deleted

## 2018-08-25 MED ORDER — VALACYCLOVIR HCL 1 G PO TABS: 1000.0000 mg | ORAL_TABLET | Freq: Two times a day (BID) | ORAL | 2 refills | Status: DC

## 2018-08-25 NOTE — Telephone Encounter (Signed)
Incoming fax request stating patient needs new prescription for the following:   Medication refill request: Valtrex Last AEX:  07-23-18 DL  Next AEX: 07-30-19  Last MMG (if hormonal medication request): 08-29-17 density B/BIRADS 1 negative  Refill authorized: please advise  Call to patient as valtrex not listed on current medication list. Patient states she forgot to discuss valtrex prescription at [facility] in 07/2018. States she is currently out of valtrex and only takes as needed when she begins to feel an outbreak. Advised would send to provider for review. Pharmacy confirmed. Routing to covering provider as Melvia Heaps, CNM is out of office.

## 2018-08-26 ENCOUNTER — Other Ambulatory Visit: Payer: Self-pay

## 2018-09-02 ENCOUNTER — Encounter: Payer: Self-pay | Admitting: Hematology and Oncology

## 2018-09-02 ENCOUNTER — Telehealth: Payer: Self-pay | Admitting: Hematology and Oncology

## 2018-09-02 NOTE — Telephone Encounter (Signed)
New hem referral received from Dr. Inda Merlin for symptomatic lymphocytosis. Pt has been cld and scheduled to see Dr. Audelia Hives on 11/6 at 11am. Pt aware to arrive 30 minutes early. Letter mailed to the pt.

## 2018-09-10 ENCOUNTER — Inpatient Hospital Stay: Payer: BLUE CROSS/BLUE SHIELD

## 2018-09-10 ENCOUNTER — Inpatient Hospital Stay: Payer: BLUE CROSS/BLUE SHIELD | Attending: Hematology and Oncology | Admitting: Hematology and Oncology

## 2018-09-10 ENCOUNTER — Encounter: Payer: Self-pay | Admitting: Hematology and Oncology

## 2018-09-10 ENCOUNTER — Telehealth: Payer: Self-pay | Admitting: Hematology and Oncology

## 2018-09-10 VITALS — BP 110/60 | HR 77 | Temp 98.0°F | Resp 18 | Ht 66.25 in | Wt 197.0 lb

## 2018-09-10 DIAGNOSIS — F329 Major depressive disorder, single episode, unspecified: Secondary | ICD-10-CM

## 2018-09-10 DIAGNOSIS — F410 Panic disorder [episodic paroxysmal anxiety] without agoraphobia: Secondary | ICD-10-CM | POA: Diagnosis not present

## 2018-09-10 DIAGNOSIS — I10 Essential (primary) hypertension: Secondary | ICD-10-CM | POA: Diagnosis not present

## 2018-09-10 DIAGNOSIS — D72820 Lymphocytosis (symptomatic): Secondary | ICD-10-CM

## 2018-09-10 DIAGNOSIS — R7303 Prediabetes: Secondary | ICD-10-CM | POA: Insufficient documentation

## 2018-09-10 LAB — CBC WITH DIFFERENTIAL (CANCER CENTER ONLY)
Abs Immature Granulocytes: 0.01 10*3/uL (ref 0.00–0.07)
Basophils Absolute: 0 10*3/uL (ref 0.0–0.1)
Basophils Relative: 1 %
Eosinophils Absolute: 0.1 10*3/uL (ref 0.0–0.5)
Eosinophils Relative: 2 %
HCT: 41.9 % (ref 36.0–46.0)
Hemoglobin: 13.7 g/dL (ref 12.0–15.0)
Immature Granulocytes: 0 %
Lymphocytes Relative: 54 %
Lymphs Abs: 2.7 10*3/uL (ref 0.7–4.0)
MCH: 28.5 pg (ref 26.0–34.0)
MCHC: 32.7 g/dL (ref 30.0–36.0)
MCV: 87.1 fL (ref 80.0–100.0)
Monocytes Absolute: 0.4 10*3/uL (ref 0.1–1.0)
Monocytes Relative: 7 %
Neutro Abs: 1.8 10*3/uL (ref 1.7–7.7)
Neutrophils Relative %: 36 %
Platelet Count: 260 10*3/uL (ref 150–400)
RBC: 4.81 MIL/uL (ref 3.87–5.11)
RDW: 12.6 % (ref 11.5–15.5)
WBC Count: 5 10*3/uL (ref 4.0–10.5)
nRBC: 0 % (ref 0.0–0.2)

## 2018-09-10 LAB — COMPREHENSIVE METABOLIC PANEL
ALT: 36 U/L (ref 0–44)
AST: 32 U/L (ref 15–41)
Albumin: 4.6 g/dL (ref 3.5–5.0)
Alkaline Phosphatase: 83 U/L (ref 38–126)
Anion gap: 11 (ref 5–15)
BUN: 10 mg/dL (ref 6–20)
CO2: 26 mmol/L (ref 22–32)
Calcium: 10 mg/dL (ref 8.9–10.3)
Chloride: 105 mmol/L (ref 98–111)
Creatinine, Ser: 0.91 mg/dL (ref 0.44–1.00)
GFR calc Af Amer: >60 mL/min (ref >60)
GFR calc non Af Amer: >60 mL/min (ref >60)
Glucose, Bld: 98 mg/dL (ref 70–99)
Potassium: 4.2 mmol/L (ref 3.5–5.1)
Sodium: 142 mmol/L (ref 135–145)
Total Bilirubin: 0.9 mg/dL (ref 0.3–1.2)
Total Protein: 8.1 g/dL (ref 6.5–8.1)

## 2018-09-10 LAB — LACTATE DEHYDROGENASE: LDH: 203 U/L — ABNORMAL HIGH (ref 98–192)

## 2018-09-10 LAB — C-REACTIVE PROTEIN: CRP: <0.8 mg/dL (ref <1.0)

## 2018-09-10 LAB — SAVE SMEAR(SSMR), FOR PROVIDER SLIDE REVIEW

## 2018-09-10 NOTE — Telephone Encounter (Signed)
Appts scheduled avs / calendar declined per 11/6 los

## 2018-09-10 NOTE — Patient Instructions (Signed)
We discussed in detail the results of your laboratory studies done previously.  They suggest a normal white blood cell count with increased lymphocytes.  Your neutrophil count is normal.  Both your hemoglobin (red blood cells) and platelet count are also normal.  The laboratory studies will be done today to determine if this is a primary or reactive (secondary) explanation for your lymphocytosis.  Your peripheral blood smear will be reviewed.  Barring any unforeseen complications, your next scheduled doctor visit to discuss those results is on November 20.  Please do not hesitate to call should any new or untoward problems arise.  Thank you for coming by! Ladona Ridgel, MD Hematology/Oncology

## 2018-09-10 NOTE — Progress Notes (Signed)
Hematology/Oncology Outpatient  Initial Consultation  Patient Name:  Vickie Perry  DOB: [birth year]   Date of Service: September 10, 2018  Referring Provider: Josetta Huddle, MD [address] [place], [place] [address]   Consulting Physician: Henreitta Leber, MD Hematology/Oncology  Reason for Referral: In the setting of lymphocytosis without leukocytosis, anemia, or thrombocytopenia, she presents now for further diagnostic and therapeutic recommendations.  History Present Illness: Vickie Perry is a 57 year old resident of [place] whose past medical history is significant for prediabetes; primary hypertension; gestational hypertension; herpes simplex type II; mitral regurgitation; allergic rhinitis; mild anxiety disorder/panic attacks; chronic depression; dyslipidemia; adult onset asthma; and mild degenerative joint disease involving the knees. Her primary care physician is Dr. Josetta Huddle. She is alone at this first visit.  Over an indeterminate period, her complete blood count was abnormal. On April 08, 2018: A complete blood count showed hemoglobin 13.1 hematocrit 39.0 MCV 86.7 MCH 29.2 RDW 14.0 WBC 4.3 with 54% neutrophils 33% lymphocytes 8% monocytes 3% eosinophils 1% basophil; platelets 253,000.  On August 25, 2018: A complete blood count showed hemoglobin 14.1 hematocrit 41.7 MCV 85 MCH 28.8 RDW 13.5 WBC 5.8 with 37% neutrophils 50% lymphocytes 7% monocytes 3% eosinophils 3% basophil; platelets 245,000.  She has no coronary artery disease, angina, or cardiac dysrhythmia.  She has no seizure disorder or stroke syndrome.  She reports no thyroid disease.  There is no peptic ulcer or gastroesophageal reflux disease.  She has no viral hepatitis, inflammatory bowel disease, or symptomatic diverticulosis.  Her last colonoscopy was 5 years earlier, reportedly normal.  She admits to infrequent hemorrhoids with scant bleeding when wiping.  She has no prior blood disorder  or personal history of cancer.  She has no rheumatoid or gouty arthritis.  She denies peripheral arterial or venous thromboembolic disease.  Her appetite has been stable while she has voluntarily lost 10 pounds over the past several weeks. She has no unusual headache, dizziness, lightheadedness, syncope, near syncopal episodes.  There are no visual changes or hearing deficit.  She has no unusual cough, sore throat, or orthopnea.  She denies dyspnea either at rest or on exertion.  She has no pain or difficulty in swallowing. No fever, shaking chills, sweats, or flulike symptoms are evident.    She has no heartburn or indigestion. There is no nausea, vomiting, diarrhea, or constipation.  She moves her bowels twice daily. She denies melena or bright red blood per rectum.  No urinary frequency, urgency, hematuria, or dysuria are evident.  She has no swelling of her ankles.  There is no bleeding tendency or easy bruisability.  It is with this background she presents now for further diagnostic and therapeutic recommendations in the setting of lymphocytosis without leukocytosis, anemia, or thrombocytopenia as outlined above.  Past Medical History:  Diagnosis Date  . Allergic rhinitis   . Anxiety   . Asthma   . Depression    2005  . Dyslipidemia   . Edema in pregnancy   . Exercise induced bronchospasm   . Family history of early CAD 07/04/2017  . HSV-2 (herpes simplex virus 2) infection   . Hypertension   . Hypophysitis ([place])    H/O Autoimmune  . Mitral regurgitation   . Palpitations   . Panic attack   . Perennial allergic rhinitis   . PIH (pregnancy induced hypertension)    P partum// severe   . Tonsillar calculus 05/2012   Dr Orpah Greek bates  . Torn meniscus  Past Surgical History:  Procedure Laterality Date  . ABLATION  2007   HTA  . CESAREAN SECTION     x 2// Due to HSV2  . FOOT SURGERY    . KNEE SURGERY Left 2019  . plastic eye surgery     Gynecologic History: Her menarche was at  age 82 years. Her last menstruation was at age 70 years. She had uterine ablation at the age of 17 years. There is no hormone replacement therapy. She was on oral contraception for 4 years beginning in her teens.  She is a gravida 47 para 2 miscarriage 9.  Her first pregnancy was at age 62.  Her last pregnancy was at age 26 years. Her last screening mammogram was in 2019. Her last Pap smear was in September 2017.  Family History  Problem Relation Age of Onset  . Prostate cancer Father   . Heart disease Father   . Hyperlipidemia Brother   . Heart disease Mother   . Heart disease Maternal Grandfather   . Heart disease Brother   . Hyperlipidemia Brother   . Heart disease Brother   . Hyperlipidemia Brother   . Hyperlipidemia Brother   . Hyperlipidemia Brother   Mother: Deceased: Age 60 years: Hereditary hemochromatosis/CAD/COPD. Father: Deceased: Age 96 years: Prostate cancer (age 74). Brothers (5): CAD/atrial fibrillation/skin cancer Sisters (1): No medical problems.  Social History   Socioeconomic History  . Marital status: Married    Spouse name: Not on file  . Number of children: Not on file  . Years of education: Not on file  . Highest education level: Not on file  Occupational History  . Not on file  Social Needs  . Financial resource strain: Not on file  . Food insecurity:    Worry: Not on file    Inability: Not on file  . Transportation needs:    Medical: Not on file    Non-medical: Not on file  Tobacco Use  . Smoking status: Never Smoker  . Smokeless tobacco: Never Used  Substance and Sexual Activity  . Alcohol use: Not Currently  . Drug use: No  . Sexual activity: Yes    Partners: Male    Birth control/protection: Other-see comments    Comment: vasectomy  Lifestyle  . Physical activity:    Days per week: Not on file    Minutes per session: Not on file  . Stress: Not on file  Relationships  . Social connections:    Talks on phone: Not on file    Gets  together: Not on file    Attends religious service: Not on file    Active member of club or organization: Not on file    Attends meetings of clubs or organizations: Not on file    Relationship status: Not on file  . Intimate partner violence:    Fear of current or ex partner: Not on file    Emotionally abused: Not on file    Physically abused: Not on file    Forced sexual activity: Not on file  Other Topics Concern  . Not on file  Social History Narrative  . 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 She has been married for 30 years. She has no children. She is a lifetime non-smoker Her alcohol intake includes twice weekly beer No recreational drug use  Transfusion History: No prior transfusion  Exposure History: She has no known exposure to toxic chemicals, radiation, or pesticides.  Allergies  Allergen  Reactions  . Erythromycin Anaphylaxis  . Erythromycin Base Other (See Comments)  She has no food allergies She has nonspecific seasonal allergies  Current Outpatient Medications on File Prior to Visit  Medication Sig  . albuterol (PROVENTIL HFA;VENTOLIN HFA) 108 (90 BASE) MCG/ACT inhaler Inhale 2 puffs into the lungs every 4 (four) hours as needed for wheezing or shortness of breath.   Marland Kitchen aspirin 81 MG tablet Take 81 mg by mouth daily.  . Clindamycin-Benzoyl Per, Refr, gel Reported on 11/17/2015  . clonazePAM (KLONOPIN) 1 MG tablet Take 1 mg by mouth daily.  . phentermine (ADIPEX-P) 37.5 MG tablet Take 37.5 mg by mouth daily.  Marland Kitchen QVAR 80 MCG/ACT inhaler Inhale 2 puffs into the lungs as needed.   . rosuvastatin (CRESTOR) 10 MG tablet Take 5 mg by mouth daily.  . sertraline (ZOLOFT) 50 MG tablet take 1/2 daily  . tretinoin (RETIN-A) 0.05 % cream   . triamcinolone cream (KENALOG) 0.1 % Apply 1 application topically as needed. Reported on 11/17/2015  . valACYclovir (VALTREX) 1000 MG tablet Take 1 tablet (1,000 mg total) by mouth 2 (two) times daily. Take for 3 days as  needed for an outbreak.  . valsartan (DIOVAN) 160 MG tablet Take 80 mg by mouth daily.   No current facility-administered medications on file prior to visit.     Review of Systems: Constitutional: No fever, sweats, or shaking chills. No appetite deficit.  Voluntary 10 pound weight loss over the past 2 months.  Energy level stable. Skin: No rash, scaling, sores, lumps, or jaundice. HEENT: No visual changes or hearing deficit. Pulmonary: No unusual cough, sore throat, or orthopnea; adult onset bronchial asthma Breasts: No complaints. Cardiovascular: No coronary artery disease, angina, or myocardial infarction.  No cardiac dysrhythmia; essential hypertension and dyslipidemia. Gastrointestinal: No indigestion, dysphagia, abdominal pain, diarrhea, or constipation.  No change in bowel habits.  No nausea or vomiting.  No melena or bright red blood per rectum. Genitourinary: No urinary frequency, urgency, hematuria, or dysuria. Musculoskeletal: She has mild degenerative changes in her knees.  No new arthralgias or myalgias; no joint swelling, pain, or instability. Hematologic: No bleeding tendency or easy bruisability. Endocrine: No intolerance to hot or cold; no thyroid disease; prediabetes. Vascular: No peripheral arterial or venous thromboembolic disease. Psychological: Anxiety disorder/panic attacks; chronic depression without suicidal ideation. Neurological: No dizziness, lightheadedness, syncope, or near syncopal episodes; no numbness or tingling in the fingers or toes.  Physical Examination: Vital Signs: Body surface area is 2.04 meters squared.  Vitals:   09/10/18 1138  BP: 110/60  Pulse: 77  Resp: 18  Temp: 98 F (36.7 C)  SpO2: 98%    Filed Weights   09/10/18 1138  Weight: 197 lb (89.4 kg)  ECOG PERFORMANCE STATUS: 0 Constitutional:  Aysia Lowder is fully nourished and developed. She looks age appropriate. She is friendly and cooperative without respiratory compromise at  rest. Skin: No rashes, scaling, dryness, jaundice, or itching. HEENT: Head is normocephalic and atraumatic.  Pupils are equal round and reactive to light and accommodation.  Sclerae are anicteric.  Conjunctivae are pink.  No sinus tenderness nor oropharyngeal lesions.  Lips without cracking or peeling; tongue without mass, inflammation, or nodularity.  Mucous membranes are moist. Neck: Supple and symmetric.  No jugular venous distention or thyromegaly.  Trachea is midline. Lymphatics: No cervical or supraclavicular lymphadenopathy.  No epitrochlear, axillary, or inguinal lymphadenopathy is appreciated. Respiratory/chest: Thorax is symmetrical.  Breath sounds are clear to auscultation and percussion.  Normal excursion  and respiratory effort. Back: Symmetric without deformity or tenderness. Cardiovascular: Heart rate and rhythm are regular without murmurs. Gastrointestinal: Abdomen is soft, nontender; no organomegaly.  Bowel sounds are normoactive.  No masses are appreciated. Extremities: In the lower extremities, there is no asymmetric swelling, erythema, tenderness, or cord formation.  No clubbing, cyanosis, nor edema. Hematologic: No petechiae, hematomas, or ecchymoses. Psychological:  She is oriented to person, place, and time; normal affect, memory, and cognition. Neurological: There are no gross neurologic deficits.  Laboratory Results: I have reviewed the data as listed:  Ref Range & Units 13:34 31mo ago 58yr ago 75yr ago  WBC Count 4.0 - 10.5 K/uL 5.0  7.3 R 5.1 R 6.9   RBC 3.87 - 5.11 MIL/uL 4.81  4.43 R 4.54 R 4.66   Hemoglobin 12.0 - 15.0 g/dL 13.7  12.7 R 12.9 R 13.5   HCT 36.0 - 46.0 % 41.9  37.6 R 38.8 R 40.6   MCV 80.0 - 100.0 fL 87.1  85 R 85.5  87.1 R  MCH 26.0 - 34.0 pg 28.5  28.7 R 28.4 R 29.0   MCHC 30.0 - 36.0 g/dL 32.7  33.8 R 33.2 R 33.3   RDW 11.5 - 15.5 % 12.6  13.6 R 13.8 R 13.8   Platelet Count 150 - 400 K/uL 260  243 R 291 R 347   nRBC 0.0 - 0.2 % 0.0        Neutrophils Relative % % 36  21 R 60  78High  R  Neutro Abs 1.7 - 7.7 K/uL 1.8  1.5 R 3,060 R 5.4   Lymphocytes Relative % 54   29  14 R  Lymphs Abs 0.7 - 4.0 K/uL 2.7  4.8High  R 1,479 R 1.0   Monocytes Relative % 7   7  6  R  Monocytes Absolute 0.1 - 1.0 K/uL 0.4   357 R 0.4   Eosinophils Relative % 2   3  1  R  Eosinophils Absolute 0.0 - 0.5 K/uL 0.1   153 R 0.1 R  Basophils Relative % 1   1  1  R  Basophils Absolute 0.0 - 0.1 K/uL 0.0  0.1 R 51 R 0.1   Immature Granulocytes % 0  0 R    Abs Immature Granulocytes 0.00 - 0.07 K/uL 0.01        Ref Range & Units 13:34 27yr ago  Sodium 135 - 145 mmol/L 142  138 R  Potassium 3.5 - 5.1 mmol/L 4.2  4.2 R  Chloride 98 - 111 mmol/L 105  102 R  CO2 22 - 32 mmol/L 26  23 R  Glucose, Bld 70 - 99 mg/dL 98  86 R  BUN 6 - 20 mg/dL 10  14 R  Creatinine, Ser 0.44 - 1.00 mg/dL 0.91  0.84 R  Calcium 8.9 - 10.3 mg/dL 10.0  9.6 R  Total Protein 6.5 - 8.1 g/dL 8.1  7.2 R  Albumin 3.5 - 5.0 g/dL 4.6  4.5 R  AST 15 - 41 U/L 32  20 R  ALT 0 - 44 U/L 36  15 R  Alkaline Phosphatase 38 - 126 U/L 83  70 R  Total Bilirubin 0.3 - 1.2 mg/dL 0.9  0.8 R  GFR calc non Af Amer >60 mL/min >60    GFR calc Af Amer >60 mL/min >60    LDH 203 CRP <0.8  Summary/Assessment: In the setting of lymphocytosis without leukocytosis, anemia, or thrombocytopenia, she presents now for further  diagnostic and therapeutic recommendations.  Over an indeterminate period, her complete blood count was abnormal. On April 08, 2018: A complete blood count showed hemoglobin 13.1 hematocrit 39.0 MCV 86.7 MCH 29.2 RDW 14.0 WBC 4.3 with 54% neutrophils 33% lymphocytes 8% monocytes 3% eosinophils 1% basophil; platelets 253,000.  On August 25, 2018: A complete blood count showed hemoglobin 14.1 hematocrit 41.7 MCV 85 MCH 28.8 RDW 13.5 WBC 5.8 with 37% neutrophils 50% lymphocytes 7% monocytes 3% eosinophils 3% basophil; platelets 245,000.  She has no coronary artery disease, angina, or cardiac  dysrhythmia. She has no seizure disorder or stroke syndrome.  She reports no thyroid disease. There is no peptic ulcer or gastroesophageal reflux disease.  She has no viral hepatitis, inflammatory bowel disease, or symptomatic diverticulosis.  Her last colonoscopy was 5 years earlier, reportedly normal.  She admits to infrequent hemorrhoids with scant bleeding when wiping.  She has no prior blood disorder or personal history of cancer.  She has no rheumatoid or gouty arthritis.  She denies peripheral arterial or venous thromboembolic disease.  Her appetite has been stable while she has voluntarily lost 10 pounds over the past several weeks.  She has no unusual headache, dizziness, lightheadedness, syncope, near syncopal episodes.  There are no visual changes or hearing deficit. She has no unusual cough, sore throat, or orthopnea.  She denies dyspnea either at rest or on exertion.  She has no pain or difficulty in swallowing.  No fever, shaking chills, sweats, or flulike symptoms are evident. She has no heartburn or indigestion. There is no nausea, vomiting, diarrhea, or constipation.  She moves her bowels twice daily. She denies melena or bright red blood per rectum.  No urinary frequency, urgency, hematuria, or dysuria are evident. She has no swelling of her ankles.  There is no bleeding tendency or easy bruisability.   Her other comorbid problems include prediabetes; primary hypertension; gestational hypertension; herpes simplex type II; mitral regurgitation; allergic rhinitis; mild anxiety disorder/panic attacks; chronic depression; dyslipidemia; adult onset asthma; and mild degenerative joint disease involving the knees.   Recommendation/Plan: We discussed in detail her previous laboratory studies.  Those studies suggest a normal white blood cell count with lymphocytic shift.  Her absolute neutrophil count was normal.  Both hemoglobin/hematocrit and platelet count were also normal.  Laboratory studies  were obtained today to confirm the above described findings. Those results were not available at the time of discharge. Some of those laboratory studies are detailed above.  Her peripheral blood smear will be reviewed.  Barring any unforeseen complications, her next scheduled doctor visit to discuss those results is on November 20.  She was advised to call us in the interim should any new or untoward problems arise.  The total time spent discussing the her most recent laboratory studies, physical examination, role and rationale for evaluating lymphocytosis without leukocytosis and recommendations was 50 minutes. At least 50% of that time was spent face-to-face in discussion, counseling, and answering questions. There was ample time allotted to answer all questions.  This note was dictated using voice activated technology/software.  Unfortunately, typographical errors are not uncommon, and transcription is subject to mistakes and regrettably misinterpretation.  If necessary, clarification of the above information can be discussed with me at any time.  Thank you Dr. Inda Merlin for allowing my participation in the care of Maryfrances Portugal. I will keep you closely informed as the results of her preliminary laboratory data become available.  Please do not hesitate to call should any questions  arise regarding this initial consultation and discussion.  FOLLOW UP: AS DIRECTED   cc:         Josetta Huddle, MD   Henreitta Leber, MD  Hematology/Oncology [facility] [address] [place], [place] [address] Office: [phone] KZGF: [phone]

## 2018-09-10 NOTE — Telephone Encounter (Signed)
No 1/16 los.  

## 2018-09-11 LAB — ERYTHROPOIETIN: Erythropoietin: 5.6 m[IU]/mL (ref 2.6–18.5)

## 2018-09-11 LAB — BETA 2 MICROGLOBULIN, SERUM: Beta-2 Microglobulin: 2.2 mg/L (ref 0.6–2.4)

## 2018-09-12 LAB — FLOW CYTOMETRY

## 2018-09-18 ENCOUNTER — Other Ambulatory Visit: Payer: Self-pay | Admitting: Obstetrics and Gynecology

## 2018-09-18 NOTE — Telephone Encounter (Signed)
Will need to call pharmacy for what directions she has used with Valtrex, none on script prior to refill

## 2018-09-18 NOTE — Telephone Encounter (Signed)
Medication refill request: Valtrex 1000 Last AEX:  07/23/18 Next AEX: 07/30/19 Last MMG (if hormonal medication request): 08/29/17 Bi-rads 1 Neg  Refill authorized: #[id number] with 1 RF

## 2018-09-24 ENCOUNTER — Other Ambulatory Visit: Payer: Self-pay | Admitting: Hematology and Oncology

## 2018-09-24 ENCOUNTER — Inpatient Hospital Stay: Payer: BLUE CROSS/BLUE SHIELD | Admitting: Hematology and Oncology

## 2018-09-24 ENCOUNTER — Telehealth: Payer: Self-pay | Admitting: Hematology and Oncology

## 2018-09-24 ENCOUNTER — Encounter: Payer: Self-pay | Admitting: Hematology and Oncology

## 2018-09-24 VITALS — BP 133/80 | HR 74 | Temp 98.6°F | Resp 18 | Ht 66.25 in | Wt 200.7 lb

## 2018-09-24 DIAGNOSIS — F329 Major depressive disorder, single episode, unspecified: Secondary | ICD-10-CM

## 2018-09-24 DIAGNOSIS — D72820 Lymphocytosis (symptomatic): Secondary | ICD-10-CM | POA: Diagnosis not present

## 2018-09-24 DIAGNOSIS — I10 Essential (primary) hypertension: Secondary | ICD-10-CM | POA: Diagnosis not present

## 2018-09-24 DIAGNOSIS — F410 Panic disorder [episodic paroxysmal anxiety] without agoraphobia: Secondary | ICD-10-CM | POA: Diagnosis not present

## 2018-09-24 DIAGNOSIS — R7303 Prediabetes: Secondary | ICD-10-CM

## 2018-09-24 NOTE — Telephone Encounter (Signed)
Pt declined avs and calendar  °

## 2018-09-24 NOTE — Patient Instructions (Signed)
We discussed today the results of your laboratory studies from November 6.  The lymphocytes are still predominant but normal in appearance.  The results of your "flow cytometry" did not show any evidence of an underlying lymphoproliferative disorder.  You have enough of the good cells to protect you from infection.  This phenomenon is generally associated with a transient "subclinical infection."  I would however repeat the complete blood count in 6 weeks to assure its normalization.  An appointment with lab work has been requested for December 30 with Dr. Benay Spice.  Please do not hesitate to call should any questions arise in the interim.  All the best to you! Happy happy Thanksgiving  Ladona Ridgel, MD Hematology/Oncology

## 2018-09-24 NOTE — Progress Notes (Signed)
Hematology/Oncology Outpatient Progress Note  Patient Name:  Vickie Perry  DOB: 15-Apr-1961   Date of Service: September 24, 2018  Referring Provider: Josetta Huddle, MD [address] [place],  [address]   Consulting Physician: Henreitta Leber, MD Hematology/Oncology  Reason for Visit: In the setting of lymphocytosis without leukocytosis, anemia, or thrombocytopenia, she presents now for the results of her preliminary laboratory evaluation with recommendations.  Brief History: Vickie Perry is a 57 year old resident of [place] whose past medical history is significant for prediabetes; primary hypertension; gestational hypertension; herpes simplex type II; mitral regurgitation; allergic rhinitis; mild anxiety disorder/panic attacks; chronic depression; dyslipidemia; adult onset asthma; and mild degenerative joint disease involving the knees. Her primary care physician is Dr. Josetta Huddle. She is alone at this visit. Over an indeterminate period, her complete blood count was abnormal. On April 08, 2018: A complete blood count showed hemoglobin 13.1 hematocrit 39.0 MCV 86.7 MCH 29.2 RDW 14.0 WBC 4.3 with 54% neutrophils 33% lymphocytes 8% monocytes 3% eosinophils 1% basophil; platelets 253,000.  On August 25, 2018: A complete blood count showed hemoglobin 14.1 hematocrit 41.7 MCV 85 MCH 28.8 RDW 13.5 WBC 5.8 with 37% neutrophils 50% lymphocytes 7% monocytes 3% eosinophils 3% basophil; platelets 245,000. It is with this background she presents now for the results of her preliminary laboratory evaluation and recommendations in the setting of lymphocytosis without leukocytosis, neutropenia, anemia, or thrombocytopenia as outlined above.  Interval History: In the interim since her last visit, she reports no new problems, medications, or complaints. Her appetite has been stable while she has voluntarily lost 10 pounds over the past several weeks. She has no unusual  headache, dizziness, lightheadedness, syncope, near syncopal episodes.  There are no visual changes or hearing deficit.  She has no unusual cough, sore throat, or orthopnea. She denies dyspnea either at rest or on exertion.  She has no pain or difficulty in swallowing. No fever, shaking chills, sweats, or flulike symptoms are evident. She has no heartburn or indigestion. There is no nausea, vomiting, diarrhea, or constipation.  She moves her bowels twice daily. She denies melena or bright red blood per rectum.  No urinary frequency, urgency, hematuria, or dysuria are evident. She has no swelling of her ankles.  There is no bleeding tendency or easy bruisability.  She has no new focal areas of bone, joint, muscle pain.  Past Medical History:  Diagnosis Date  . Allergic rhinitis   . Anxiety   . Asthma   . Depression    2005  . Dyslipidemia   . Edema in pregnancy   . Exercise induced bronchospasm   . Family history of early CAD 07/04/2017  . HSV-2 (herpes simplex virus 2) infection   . Hypertension   . Hypophysitis ([place])    H/O Autoimmune  . Mitral regurgitation   . Palpitations   . Panic attack   . Perennial allergic rhinitis   . PIH (pregnancy induced hypertension)    P partum// severe   . Tonsillar calculus 05/2012   Dr Orpah Greek bates  . Torn meniscus    Past Surgical History:  Procedure Laterality Date  . ABLATION  2007   HTA  . CESAREAN SECTION     x 2// Due to HSV2  . FOOT SURGERY    . KNEE SURGERY Left 2019  . plastic eye surgery     Allergies  Allergen Reactions  . Erythromycin Anaphylaxis  . Erythromycin Base Other (See Comments)  She has no food  allergies She has nonspecific seasonal allergies  Current Outpatient Medications on File Prior to Visit  Medication Sig  . albuterol (PROVENTIL HFA;VENTOLIN HFA) 108 (90 BASE) MCG/ACT inhaler Inhale 2 puffs into the lungs every 4 (four) hours as needed for wheezing or shortness of breath.   Marland Kitchen aspirin 81 MG tablet Take 81 mg  by mouth daily.  . Clindamycin-Benzoyl Per, Refr, gel Reported on 11/17/2015  . clonazePAM (KLONOPIN) 1 MG tablet Take 1 mg by mouth daily.  . phentermine (ADIPEX-P) 37.5 MG tablet Take 37.5 mg by mouth daily.  Marland Kitchen QVAR 80 MCG/ACT inhaler Inhale 2 puffs into the lungs as needed.   . rosuvastatin (CRESTOR) 10 MG tablet Take 5 mg by mouth daily.  . sertraline (ZOLOFT) 50 MG tablet take 1/2 daily  . tretinoin (RETIN-A) 0.05 % cream   . triamcinolone cream (KENALOG) 0.1 % Apply 1 application topically as needed. Reported on 11/17/2015  . valACYclovir (VALTREX) 1000 MG tablet Take 1 tablet (1,000 mg total) by mouth 2 (two) times daily. Take for 3 days as needed for an outbreak.  . valsartan (DIOVAN) 160 MG tablet Take 80 mg by mouth daily.   No current facility-administered medications on file prior to visit.     Review of Systems: Constitutional: No fever, sweats, or shaking chills. No appetite deficit.  Voluntary 10 pound weight loss over the past 2 months.  Energy level stable. Skin: No rash, scaling, sores, lumps, or jaundice. HEENT: No visual changes or hearing deficit. Pulmonary: No unusual cough, sore throat, or orthopnea; adult onset bronchial asthma Breasts: No complaints. Cardiovascular: No coronary artery disease, angina, or myocardial infarction.  No cardiac dysrhythmia; essential hypertension and dyslipidemia. Gastrointestinal: No indigestion, dysphagia, abdominal pain, diarrhea, or constipation.  No change in bowel habits.  No nausea or vomiting.  No melena or bright red blood per rectum. Genitourinary: No urinary frequency, urgency, hematuria, or dysuria. Musculoskeletal: She has mild degenerative changes in her knees.  No new arthralgias or myalgias; no joint swelling, pain, or instability. Hematologic: No bleeding tendency or easy bruisability. Endocrine: No intolerance to hot or cold; no thyroid disease; prediabetes. Vascular: No peripheral arterial or venous thromboembolic  disease. Psychological: Anxiety disorder/panic attacks; chronic depression without suicidal ideation. Neurological: No dizziness, lightheadedness, syncope, or near syncopal episodes; no numbness or tingling in the fingers or toes.  Physical Examination: Vital Signs: Body surface area is 2.06 meters squared.  Vitals:   09/24/18 1552  BP: 133/80  Pulse: 74  Resp: 18  Temp: 98.6 F (37 C)  SpO2: 100%    Filed Weights   09/24/18 1552  Weight: 200 lb 11.2 oz (91 kg)  ECOG PERFORMANCE STATUS: 0 Constitutional:  Vickie Perry is fully nourished and developed. She looks age appropriate. She is friendly and cooperative without respiratory compromise at rest. Skin: No rashes, scaling, dryness, jaundice, or itching. HEENT: Head is normocephalic and atraumatic.  Pupils are equal round and reactive to light and accommodation.  Sclerae are anicteric.  Conjunctivae are pink.  No sinus tenderness nor oropharyngeal lesions.  Lips without cracking or peeling; tongue without mass, inflammation, or nodularity.  Mucous membranes are moist. Neck: Supple and symmetric.  No jugular venous distention or thyromegaly.  Trachea is midline. Lymphatics: No cervical or supraclavicular lymphadenopathy.  No epitrochlear, axillary, or inguinal lymphadenopathy is appreciated. Respiratory/chest: Thorax is symmetrical.  Breath sounds are clear to auscultation and percussion.  Normal excursion and respiratory effort. Back: Symmetric without deformity or tenderness. Cardiovascular: Heart rate and rhythm are  regular without murmurs. Gastrointestinal: Abdomen is soft, nontender; no organomegaly.  Bowel sounds are normoactive.  No masses are appreciated. Extremities: In the lower extremities, there is no asymmetric swelling, erythema, tenderness, or cord formation.  No clubbing, cyanosis, nor edema. Hematologic: No petechiae, hematomas, or ecchymoses. Psychological:  She is oriented to person, place, and time; normal  affect, memory, and cognition. Neurological: There are no gross neurologic deficits.  Laboratory Results: I have reviewed the data as listed: September 10, 2018  Ref Range & Units 13:34 2mo ago 70yr ago 27yr ago  WBC Count 4.0 - 10.5 K/uL 5.0  7.3 R 5.1 R 6.9   RBC 3.87 - 5.11 MIL/uL 4.81  4.43 R 4.54 R 4.66   Hemoglobin 12.0 - 15.0 g/dL 13.7  12.7 R 12.9 R 13.5   HCT 36.0 - 46.0 % 41.9  37.6 R 38.8 R 40.6   MCV 80.0 - 100.0 fL 87.1  85 R 85.5  87.1 R  MCH 26.0 - 34.0 pg 28.5  28.7 R 28.4 R 29.0   MCHC 30.0 - 36.0 g/dL 32.7  33.8 R 33.2 R 33.3   RDW 11.5 - 15.5 % 12.6  13.6 R 13.8 R 13.8   Platelet Count 150 - 400 K/uL 260  243 R 291 R 347   nRBC 0.0 - 0.2 % 0.0      Neutrophils Relative % % 36  21 R 60  78High  R  Neutro Abs 1.7 - 7.7 K/uL 1.8  1.5 R 3,060 R 5.4   Lymphocytes Relative % 54   29  14 R  Lymphs Abs 0.7 - 4.0 K/uL 2.7  4.8High  R 1,479 R 1.0   Monocytes Relative % 7   7  6  R  Monocytes Absolute 0.1 - 1.0 K/uL 0.4   357 R 0.4   Eosinophils Relative % 2   3  1  R  Eosinophils Absolute 0.0 - 0.5 K/uL 0.1   153 R 0.1 R  Basophils Relative % 1   1  1  R  Basophils Absolute 0.0 - 0.1 K/uL 0.0  0.1 R 51 R 0.1   Immature Granulocytes % 0  0 R    Abs Immature Granulocytes 0.00 - 0.07 K/uL 0.01        Ref Range & Units 13:34 81yr ago  Sodium 135 - 145 mmol/L 142  138 R  Potassium 3.5 - 5.1 mmol/L 4.2  4.2 R  Chloride 98 - 111 mmol/L 105  102 R  CO2 22 - 32 mmol/L 26  23 R  Glucose, Bld 70 - 99 mg/dL 98  86 R  BUN 6 - 20 mg/dL 10  14 R  Creatinine, Ser 0.44 - 1.00 mg/dL 0.91  0.84 R  Calcium 8.9 - 10.3 mg/dL 10.0  9.6 R  Total Protein 6.5 - 8.1 g/dL 8.1  7.2 R  Albumin 3.5 - 5.0 g/dL 4.6  4.5 R  AST 15 - 41 U/L 32  20 R  ALT 0 - 44 U/L 36  15 R  Alkaline Phosphatase 38 - 126 U/L 83  70 R  Total Bilirubin 0.3 - 1.2 mg/dL 0.9  0.8 R  GFR calc non Af Amer >60 mL/min >60    GFR calc Af Amer >60 mL/min >60    LDH 203 CRP <0.8 Erythropoietin 5.6 Beta-2 microglobulin  2.2  Peripheral Blood Flow Cytometry - NO MONOCLONAL B-CELL OR PHENOTYPICALLY ABERRANT T-CELL POPULATION IDENTIFIED - INVERTED CD4: CD8 RATIO PRESENT - SEE COMMENT  Diagnosis Comment: An inverted CD4: CD8 ratio is noted. A CD16+/ CD56+ population comprises 10% of all lymphocytes. These results must be interpreted in the context of the patients clinical history, physical findings, and other laboratory data. Thressa Sheller MD Pathologist, Electronic Signature (Case signed 09/12/2018)  Peripheral Blood Flow Cytometry Microscopic Gated population: Flow cytometric immunophenotyping is performed using antibiodies to the antigens listed in the table below. Electronic gates are placed around a cell cluster displaying light scatter properties corresponding to lymphocytes. - Abnormal Cells in gated population: N/A - Phenotype of Abnormal Cells: N/A  Summary/Assessment: In the setting of lymphocytosis without leukocytosis, anemia, or thrombocytopenia, she presents now for the results of her preliminary laboratory evaluation with recommendations.  Over an indeterminate period, her complete blood count was abnormal. On April 08, 2018: A complete blood count showed hemoglobin 13.1 hematocrit 39.0 MCV 86.7 MCH 29.2 RDW 14.0 WBC 4.3 with 54% neutrophils 33% lymphocytes 8% monocytes 3% eosinophils 1% basophil; platelets 253,000.  On August 25, 2018: A complete blood count showed hemoglobin 14.1 hematocrit 41.7 MCV 85 MCH 28.8 RDW 13.5 WBC 5.8 with 37% neutrophils 50% lymphocytes 7% monocytes 3% eosinophils 3% basophil; platelets 245,000.  On November 6, complete blood count showed normal white blood cell count with lymphocytic shift.  Her absolute neutrophil count was low-normal.  Both hemoglobin/hematocrit and platelet count were also normal.  Those results are detailed above. Peripheral blood for immunophenotypic flow cytometry revealed no evidence of a monoclonal B-cell or phenotypically aberrant T-cell  population.  The CD4:CD8 ratio was present but inverted.  A CD16+/CD 56+ population comprised 10% of all lymphocytes. This inverted CD4:CD8 ratio, most likely represents a transient perhaps subclinical infection/inflammation. A repeat complete blood count and differential is anticipated.  In the interim since her last visit, she reports no new problems, medications, or complaints. Her appetite has been stable while she has voluntarily lost 10 pounds over the past several weeks. She has no unusual headache, dizziness, lightheadedness, syncope, near syncopal episodes.  There are no visual changes or hearing deficit.  She has no unusual cough, sore throat, or orthopnea. She denies dyspnea either at rest or on exertion.  She has no pain or difficulty in swallowing. No fever, shaking chills, sweats, or flulike symptoms are evident. She has no heartburn or indigestion. There is no nausea, vomiting, diarrhea, or constipation.  She moves her bowels twice daily. She denies melena or bright red blood per rectum.  No urinary frequency, urgency, hematuria, or dysuria are evident. She has no swelling of her ankles.  There is no bleeding tendency or easy bruisability.   Her other comorbid problems include prediabetes; primary hypertension; gestational hypertension; herpes simplex type II; mitral regurgitation; allergic rhinitis; mild anxiety disorder/panic attacks; chronic depression; dyslipidemia; adult onset asthma; and mild degenerative joint disease involving the knees.   Recommendation/Plan: We discussed in detail her laboratory studies. Those studies suggest a normal white blood cell count with lymphocytic shift.  Her absolute neutrophil count was low-normal.  Both hemoglobin/hematocrit and platelet count were also normal.  We discussed also the immunophenotypic flow cytometry results which have, by itself little clinical significance unless the lymphocytosis persists.  Rather than pursue at this time an extensive  work-up and evaluation, it was decided to repeat her complete blood count in 4 weeks (6 weeks since the previous test). The simplest explanation is that this represents a transient, possibly subclinical infection/inflammation, and will eventually resolve.  She has no neutropenia.  There is no anemia or thrombocytopenia.  If her lymphocytosis persists, then I would consider a more directed approach and pursue other possible explanations.  She was comfortable with the plan. Her case was discussed with Dr. Thressa Sheller, clinical pathology.  Barring any unforeseen complications, her next scheduled doctor visit with laboratory studies is on December 30 with Dr.Brad Sherrell since I am leaving the practice.  She was advised to call us in the interim should any new or untoward problems arise.  The total time spent discussing her most recent laboratory studies, results of her flow cytometry, it's significance and recommendations was 15 minutes. At least 50% of that time was spent face-to-face in discussion, counseling, and answering questions. There was ample time allotted to answer all questions.  This note was dictated using voice activated technology/software.  Unfortunately, typographical errors are not uncommon, and transcription is subject to mistakes and regrettably misinterpretation.  If necessary, clarification of the above information can be discussed with me at any time.  FOLLOW UP: AS DIRECTED   cc:         Josetta Huddle, MD   Henreitta Leber, MD  Hematology/Oncology [place] [address] [place], [place] [address] Office: [phone] GFRE: [phone]

## 2018-11-03 ENCOUNTER — Inpatient Hospital Stay: Payer: BLUE CROSS/BLUE SHIELD

## 2018-11-03 ENCOUNTER — Telehealth: Payer: Self-pay | Admitting: *Deleted

## 2018-11-03 ENCOUNTER — Inpatient Hospital Stay: Payer: BLUE CROSS/BLUE SHIELD | Admitting: Oncology

## 2018-11-04 ENCOUNTER — Telehealth: Payer: Self-pay | Admitting: Oncology

## 2018-11-04 NOTE — Telephone Encounter (Signed)
Scheduled appt per 12/30 sch message - pt is aware of appt date and time   

## 2018-12-11 ENCOUNTER — Telehealth: Payer: Self-pay | Admitting: Internal Medicine

## 2018-12-11 NOTE — Telephone Encounter (Signed)
Per DrSherrill need to reschedule with Dr Walden Field I was not able to reach patient. I did leave a message regarding 2/13

## 2018-12-12 ENCOUNTER — Inpatient Hospital Stay: Payer: BLUE CROSS/BLUE SHIELD | Admitting: Oncology

## 2018-12-12 ENCOUNTER — Inpatient Hospital Stay: Payer: BLUE CROSS/BLUE SHIELD

## 2018-12-18 ENCOUNTER — Inpatient Hospital Stay: Payer: BLUE CROSS/BLUE SHIELD | Attending: Hematology and Oncology

## 2018-12-18 ENCOUNTER — Inpatient Hospital Stay (HOSPITAL_BASED_OUTPATIENT_CLINIC_OR_DEPARTMENT_OTHER): Payer: BLUE CROSS/BLUE SHIELD | Admitting: Internal Medicine

## 2018-12-18 VITALS — BP 134/61 | HR 70 | Temp 98.1°F | Resp 18 | Ht 66.25 in

## 2018-12-18 DIAGNOSIS — R634 Abnormal weight loss: Secondary | ICD-10-CM | POA: Diagnosis not present

## 2018-12-18 DIAGNOSIS — M25551 Pain in right hip: Secondary | ICD-10-CM | POA: Diagnosis not present

## 2018-12-18 DIAGNOSIS — R0602 Shortness of breath: Secondary | ICD-10-CM | POA: Diagnosis not present

## 2018-12-18 DIAGNOSIS — F419 Anxiety disorder, unspecified: Secondary | ICD-10-CM

## 2018-12-18 DIAGNOSIS — I10 Essential (primary) hypertension: Secondary | ICD-10-CM | POA: Diagnosis not present

## 2018-12-18 DIAGNOSIS — R1084 Generalized abdominal pain: Secondary | ICD-10-CM | POA: Insufficient documentation

## 2018-12-18 DIAGNOSIS — D72820 Lymphocytosis (symptomatic): Secondary | ICD-10-CM | POA: Diagnosis not present

## 2018-12-18 LAB — CBC WITH DIFFERENTIAL (CANCER CENTER ONLY)
Abs Immature Granulocytes: 0.05 10*3/uL (ref 0.00–0.07)
Basophils Absolute: 0 10*3/uL (ref 0.0–0.1)
Basophils Relative: 1 %
Eosinophils Absolute: 0.2 10*3/uL (ref 0.0–0.5)
Eosinophils Relative: 2 %
HCT: 36.6 % (ref 36.0–46.0)
Hemoglobin: 12.4 g/dL (ref 12.0–15.0)
Immature Granulocytes: 1 %
Lymphocytes Relative: 51 %
Lymphs Abs: 3.5 10*3/uL (ref 0.7–4.0)
MCH: 29.5 pg (ref 26.0–34.0)
MCHC: 33.9 g/dL (ref 30.0–36.0)
MCV: 87.1 fL (ref 80.0–100.0)
Monocytes Absolute: 0.4 10*3/uL (ref 0.1–1.0)
Monocytes Relative: 6 %
Neutro Abs: 2.6 10*3/uL (ref 1.7–7.7)
Neutrophils Relative %: 39 %
Platelet Count: 263 10*3/uL (ref 150–400)
RBC: 4.2 MIL/uL (ref 3.87–5.11)
RDW: 12.1 % (ref 11.5–15.5)
WBC Count: 6.7 10*3/uL (ref 4.0–10.5)
nRBC: 0 % (ref 0.0–0.2)

## 2018-12-18 NOTE — Progress Notes (Signed)
Diagnosis Lymphocytosis - Plan: CT CHEST W CONTRAST, CBC with Differential/Platelet, Comprehensive metabolic panel, Lactate dehydrogenase, Sedimentation rate, C-reactive protein, Rheumatoid factor, ANA, IFA (with reflex)  Abnormal weight loss - Plan: CT ABDOMEN PELVIS W CONTRAST  Generalized abdominal pain - Plan: CT CHEST W CONTRAST, CBC with Differential/Platelet, Comprehensive metabolic panel, Lactate dehydrogenase, Sedimentation rate, C-reactive protein, Rheumatoid factor, ANA, IFA (with reflex)  Shortness of breath - Plan: CT CHEST W CONTRAST, CBC with Differential/Platelet, Comprehensive metabolic panel, Lactate dehydrogenase, Sedimentation rate, C-reactive protein, Rheumatoid factor, ANA, IFA (with reflex)  Pain in joint involving right pelvic region and thigh - Plan: CT CHEST W CONTRAST, CBC with Differential/Platelet, Comprehensive metabolic panel, Lactate dehydrogenase, Sedimentation rate, C-reactive protein, Rheumatoid factor, ANA, IFA (with reflex)  Staging Cancer Staging No matching staging information was found for the patient.  Assessment and Plan: 1.  Lymphocytosis.  58 year old female previously followed by Dr. Audelia Hives.  Pt underwent evaluation for lymphocytosis.  Over an indeterminate period, her complete blood count was abnormal. On April 08, 2018: A complete blood count showed hemoglobin 13.1 hematocrit 39.0 MCV 86.7 MCH 29.2 RDW 14.0 WBC 4.3 with 54% neutrophils 33% lymphocytes 8% monocytes 3% eosinophils 1% basophil; platelets 253,000.  On August 25, 2018: A complete blood count showed hemoglobin 14.1 hematocrit 41.7 MCV 85 MCH 28.8 RDW 13.5 WBC 5.8 with 37% neutrophils 50% lymphocytes 7% monocytes 3% eosinophils 3% basophil; platelets 245,000.  On November 6, complete blood count showed normal white blood cell count with lymphocytic shift.  Her absolute neutrophil count was low-normal.  Both hemoglobin/hematocrit and platelet count were also normal.  Those results are detailed  above. Peripheral blood for immunophenotypic flow cytometry revealed no evidence of a monoclonal B-cell or phenotypically aberrant T-cell population.  The CD4:CD8 ratio was present but inverted.  A CD16+/CD 56+ population comprised 10% of all lymphocytes. This inverted CD4:CD8 ratio, most likely represents a transient perhaps subclinical infection/inflammation.   Pt denies fevers, chills, night sweats and has noted no adenopathy.  She reports some RLQ abdominal pain and mild SOB.  She reports she had multiple miscarriages in the past.    Labs done 12/18/2018 reviewed and showed WBC 6.7 HB 12.4 plts 263,000.  She has 51% lymphocytes.    I discussed with her she has normal WBC with no signs of anemia or thrombocytopenia.  Pt remains concerned due to mild lymphocytosis.  She will be set up for CT CAP to evaluate for any lymphadenopathy.  If scans are negative, she Will have repeat labs in 01/2019.  Will check ANA at that time.  Pt will RTC in 01/2019 for follow-up to go over scan and lab results.   2.  Abdominal pain.  Pt is set up for CT abdomen and pelvis to evaluate for any adenopathy.   3.  SOB.  She reports this is minor.  Pulse ox is 99% on room air.  She is set up for CT chest to evaluate for any adenopathy due to persistent lymphocytosis.   4.  HTN.   BP is 134/61.  Follow-up with PCP.    5.  Anxiety.  Follow-up with PCP.    25 minutes spent with more than 50% spent in counseling and coordination of care and review of records.    Interval History:  Historical data obtained from note dated 09/24/2018.  58 year old female previously followed by Dr. Audelia Hives.  Pt underwent evaluation for lymphocytosis.  Over an indeterminate period, her complete blood count was abnormal. On  April 08, 2018: A complete blood count showed hemoglobin 13.1 hematocrit 39.0 MCV 86.7 MCH 29.2 RDW 14.0 WBC 4.3 with 54% neutrophils 33% lymphocytes 8% monocytes 3% eosinophils 1% basophil; platelets 253,000.  On August 25, 2018: A  complete blood count showed hemoglobin 14.1 hematocrit 41.7 MCV 85 MCH 28.8 RDW 13.5 WBC 5.8 with 37% neutrophils 50% lymphocytes 7% monocytes 3% eosinophils 3% basophil; platelets 245,000.  On November 6, complete blood count showed normal white blood cell count with lymphocytic shift.  Her absolute neutrophil count was low-normal.  Both hemoglobin/hematocrit and platelet count were also normal.  Those results are detailed above. Peripheral blood for immunophenotypic flow cytometry revealed no evidence of a monoclonal B-cell or phenotypically aberrant T-cell population.  The CD4:CD8 ratio was present but inverted.  A CD16+/CD 56+ population comprised 10% of all lymphocytes. This inverted CD4:CD8 ratio, most likely represents a transient perhaps subclinical infection/inflammation.   Current Status:  Pt is seen today for follow-up.  She is here to go over labs.  She reports some RLQ abdominal pain.  She remains concerned about blood counts.    Problem List Patient Active Problem List   Diagnosis Date Noted  . Lymphocytosis [D72.820] 09/24/2018  . Shoulder pain [M25.519] 01/29/2018  . Pain in left knee [M25.562] 01/27/2018  . Family history of early CAD [Z82.49] 07/04/2017  . Encounter for consultation [Z71.9] 01/05/2015  . Consultation without specific complaint [Z71.9] 11/24/2014  . Reason for consultation [IMO0001] 11/09/2014  . Palpitations [R00.2] 11/12/2013  . Obesity, unspecified [E66.9] 11/12/2013  . Benign essential HTN [I10] 11/12/2013  . Mitral regurgitation [I34.0] 11/12/2013  . Facial asymmetry [Q67.0] 11/12/2013  . METATARSALGIA [M77.50] 08/01/2009  . ABNORMALITY OF GAIT [R26.9] 08/01/2009    Past Medical History Past Medical History:  Diagnosis Date  . Allergic rhinitis   . Anxiety   . Asthma   . Depression    2005  . Dyslipidemia   . Edema in pregnancy   . Exercise induced bronchospasm   . Family history of early CAD 07/04/2017  . HSV-2 (herpes simplex virus 2)  infection   . Hypertension   . Hypophysitis ([place])    H/O Autoimmune  . Mitral regurgitation   . Palpitations   . Panic attack   . Perennial allergic rhinitis   . PIH (pregnancy induced hypertension)    P partum// severe   . Tonsillar calculus 05/2012   Dr Orpah Greek bates  . Torn meniscus     Past Surgical History Past Surgical History:  Procedure Laterality Date  . ABLATION  2007   HTA  . CESAREAN SECTION     x 2// Due to HSV2  . FOOT SURGERY    . KNEE SURGERY Left 2019  . plastic eye surgery      Family History Family History  Problem Relation Age of Onset  . Prostate cancer Father   . Heart disease Father   . Hyperlipidemia Brother   . Heart disease Mother   . Heart disease Maternal Grandfather   . Heart disease Brother   . Hyperlipidemia Brother   . Heart disease Brother   . Hyperlipidemia Brother   . Hyperlipidemia Brother   . Hyperlipidemia Brother      Social History  reports that she has never smoked. She has never used smokeless tobacco. She reports previous alcohol use. She reports that she does not use drugs.  Medications  Current Outpatient Medications:  .  albuterol (PROVENTIL HFA;VENTOLIN HFA) 108 (90 BASE) MCG/ACT inhaler, Inhale  2 puffs into the lungs every 4 (four) hours as needed for wheezing or shortness of breath. , Disp: , Rfl:  .  aspirin 81 MG tablet, Take 81 mg by mouth daily., Disp: , Rfl:  .  Clindamycin-Benzoyl Per, Refr, gel, Reported on 11/17/2015, Disp: , Rfl: 6 .  clonazePAM (KLONOPIN) 1 MG tablet, Take 1 mg by mouth daily., Disp: , Rfl:  .  phentermine (ADIPEX-P) 37.5 MG tablet, Take 37.5 mg by mouth daily., Disp: , Rfl: 1 .  QVAR 80 MCG/ACT inhaler, Inhale 2 puffs into the lungs as needed. , Disp: , Rfl: 5 .  rosuvastatin (CRESTOR) 10 MG tablet, Take 5 mg by mouth daily., Disp: , Rfl:  .  sertraline (ZOLOFT) 50 MG tablet, take 1/2 daily, Disp: , Rfl: 1 .  tretinoin (RETIN-A) 0.05 % cream, , Disp: , Rfl: 9 .  triamcinolone cream  (KENALOG) 0.1 %, Apply 1 application topically as needed. Reported on 11/17/2015, Disp: , Rfl:  .  valACYclovir (VALTREX) 1000 MG tablet, Take 1 tablet (1,000 mg total) by mouth 2 (two) times daily. Take for 3 days as needed for an outbreak., Disp: 30 tablet, Rfl: 2 .  valsartan (DIOVAN) 160 MG tablet, Take 80 mg by mouth daily., Disp: , Rfl:   Allergies Erythromycin and Erythromycin base  Review of Systems Review of Systems - Oncology ROS negative other than abdominal pain and mild SOB   Physical Exam  Vitals Wt Readings from Last 3 Encounters:  09/24/18 200 lb 11.2 oz (91 kg)  09/10/18 197 lb (89.4 kg)  08/18/18 197 lb (89.4 kg)   Temp Readings from Last 3 Encounters:  12/18/18 98.1 F (36.7 C) (Oral)  09/24/18 98.6 F (37 C) (Oral)  09/10/18 98 F (36.7 C) (Oral)   BP Readings from Last 3 Encounters:  12/18/18 134/61  09/24/18 133/80  09/10/18 110/60   Pulse Readings from Last 3 Encounters:  12/18/18 70  09/24/18 74  09/10/18 77   Constitutional: Well-developed, well-nourished, and in no distress.   HENT: Head: Normocephalic and atraumatic.  Mouth/Throat: No oropharyngeal exudate. Mucosa moist. Eyes: Pupils are equal, round, and reactive to light. Conjunctivae are normal. No scleral icterus.  Neck: Normal range of motion. Neck supple. No JVD present.  Cardiovascular: Normal rate, regular rhythm and normal heart sounds.  Exam reveals no gallop and no friction rub.   No murmur heard. Pulmonary/Chest: Effort normal and breath sounds normal. No respiratory distress. No wheezes.No rales.  Abdominal: Soft. Bowel sounds are normal. No distension. Tender in RLQ.  There is no guarding.  Musculoskeletal: No edema or tenderness.  Lymphadenopathy: No cervical, axillary or supraclavicular adenopathy.  Neurological: Alert and oriented to person, place, and time. No cranial nerve deficit.  Skin: Skin is warm and dry. No rash noted. No erythema. No pallor.  Psychiatric: Affect  and judgment normal.   Labs Appointment on 12/18/2018  Component Date Value Ref Range Status  . WBC Count 12/18/2018 6.7  4.0 - 10.5 K/uL Final  . RBC 12/18/2018 4.20  3.87 - 5.11 MIL/uL Final  . Hemoglobin 12/18/2018 12.4  12.0 - 15.0 g/dL Final  . HCT 12/18/2018 36.6  36.0 - 46.0 % Final  . MCV 12/18/2018 87.1  80.0 - 100.0 fL Final  . MCH 12/18/2018 29.5  26.0 - 34.0 pg Final  . MCHC 12/18/2018 33.9  30.0 - 36.0 g/dL Final  . RDW 12/18/2018 12.1  11.5 - 15.5 % Final  . Platelet Count 12/18/2018 263  150 -  400 K/uL Final  . nRBC 12/18/2018 0.0  0.0 - 0.2 % Final  . Neutrophils Relative % 12/18/2018 39  % Final  . Neutro Abs 12/18/2018 2.6  1.7 - 7.7 K/uL Final  . Lymphocytes Relative 12/18/2018 51  % Final  . Lymphs Abs 12/18/2018 3.5  0.7 - 4.0 K/uL Final  . Monocytes Relative 12/18/2018 6  % Final  . Monocytes Absolute 12/18/2018 0.4  0.1 - 1.0 K/uL Final  . Eosinophils Relative 12/18/2018 2  % Final  . Eosinophils Absolute 12/18/2018 0.2  0.0 - 0.5 K/uL Final  . Basophils Relative 12/18/2018 1  % Final  . Basophils Absolute 12/18/2018 0.0  0.0 - 0.1 K/uL Final  . Immature Granulocytes 12/18/2018 1  % Final  . Abs Immature Granulocytes 12/18/2018 0.05  0.00 - 0.07 K/uL Final   Performed at [facility], [place] [address] [place], [place] [address]     Pathology Orders Placed This Encounter  Procedures  . CT CHEST W CONTRAST    Standing Status:   Future    Standing Expiration Date:   12/18/2019    Order Specific Question:   If indicated for the ordered procedure, I authorize the administration of contrast media per Radiology protocol    Answer:   Yes    Order Specific Question:   Preferred imaging location?    Answer:   [facility]    Order Specific Question:   Radiology Contrast Protocol - do NOT remove file path    Answer:   \\[email or web address]    Order Specific Question:   ** REASON FOR EXAM (FREE TEXT)     Answer:   lymphocytosis.  Evaluate for adenopathy  . CT ABDOMEN PELVIS W CONTRAST    Standing Status:   Future    Standing Expiration Date:   12/18/2019    Order Specific Question:   ** REASON FOR EXAM (FREE TEXT)    Answer:   lymphocytosis evaluate for adenopathy.    Order Specific Question:   If indicated for the ordered procedure, I authorize the administration of contrast media per Radiology protocol    Answer:   Yes    Order Specific Question:   Preferred imaging location?    Answer:   [facility]    Order Specific Question:   Is Oral Contrast requested for this exam?    Answer:   Yes, Per Radiology protocol    Order Specific Question:   Radiology Contrast Protocol - do NOT remove file path    Answer:   \\[email or web address]  . CBC with Differential/Platelet    Standing Status:   Future    Standing Expiration Date:   12/19/2019  . Comprehensive metabolic panel    Standing Status:   Future    Standing Expiration Date:   12/19/2019  . Lactate dehydrogenase    Standing Status:   Future    Standing Expiration Date:   12/19/2019  . Sedimentation rate    Standing Status:   Future    Standing Expiration Date:   12/19/2019  . C-reactive protein    Standing Status:   Future    Standing Expiration Date:   12/19/2019  . Rheumatoid factor    Standing Status:   Future    Standing Expiration Date:   12/19/2019  . ANA, IFA (with reflex)    Standing Status:   Future    Standing Expiration Date:   12/19/2019       Mathis Dad Gillie Fleites  MD

## 2018-12-23 ENCOUNTER — Telehealth: Payer: Self-pay | Admitting: Internal Medicine

## 2018-12-23 NOTE — Telephone Encounter (Signed)
Patient called to reschedule she will be out of town on the 4/3

## 2019-01-08 ENCOUNTER — Ambulatory Visit (HOSPITAL_COMMUNITY)
Admission: RE | Admit: 2019-01-08 | Discharge: 2019-01-08 | Disposition: A | Discharge status: Home / Self Care | Payer: BLUE CROSS/BLUE SHIELD | Source: Ambulatory Visit | Attending: Internal Medicine | Admitting: Internal Medicine

## 2019-01-08 DIAGNOSIS — R634 Abnormal weight loss: Secondary | ICD-10-CM

## 2019-01-08 DIAGNOSIS — M25551 Pain in right hip: Secondary | ICD-10-CM | POA: Diagnosis present

## 2019-01-08 DIAGNOSIS — R1084 Generalized abdominal pain: Secondary | ICD-10-CM | POA: Diagnosis present

## 2019-01-08 DIAGNOSIS — R0602 Shortness of breath: Secondary | ICD-10-CM | POA: Diagnosis present

## 2019-01-08 DIAGNOSIS — D72820 Lymphocytosis (symptomatic): Secondary | ICD-10-CM | POA: Diagnosis present

## 2019-01-08 MED ORDER — IOHEXOL 300 MG/ML  SOLN
100.0000 mL | Freq: Once | INTRAMUSCULAR | Status: AC | PRN
  Administered 2019-01-08: 100 mL via INTRAVENOUS

## 2019-01-08 MED ORDER — SODIUM CHLORIDE (PF) 0.9 % IJ SOLN
INTRAMUSCULAR | Status: AC
  Filled 2019-01-08: qty 50

## 2019-01-30 ENCOUNTER — Inpatient Hospital Stay: Payer: BLUE CROSS/BLUE SHIELD

## 2019-02-05 ENCOUNTER — Ambulatory Visit: Payer: BLUE CROSS/BLUE SHIELD | Admitting: Internal Medicine

## 2019-02-06 ENCOUNTER — Ambulatory Visit: Payer: BLUE CROSS/BLUE SHIELD | Admitting: Internal Medicine

## 2019-05-18 ENCOUNTER — Inpatient Hospital Stay: Payer: BLUE CROSS/BLUE SHIELD

## 2019-05-22 ENCOUNTER — Ambulatory Visit: Payer: BLUE CROSS/BLUE SHIELD | Admitting: Internal Medicine

## 2019-06-22 ENCOUNTER — Telehealth: Payer: Self-pay | Admitting: Hematology

## 2019-06-22 NOTE — Telephone Encounter (Signed)
[facility] transfer to [facility]. Per Dr. Maylon Peppers lab/fu late September in [place] if patient willing, if not [place]. confirmed with patient lab/fu for October in [place] office. Patient prefers [place] office and requested appointments after the week of 10/5 due to busy work schedule.

## 2019-06-22 NOTE — Telephone Encounter (Signed)
Okay to schedule as the patient requests.  Thanks.  Dr. Maylon Peppers

## 2019-07-20 ENCOUNTER — Other Ambulatory Visit: Payer: BLUE CROSS/BLUE SHIELD

## 2019-07-24 ENCOUNTER — Ambulatory Visit: Payer: BLUE CROSS/BLUE SHIELD | Admitting: Internal Medicine

## 2019-07-27 ENCOUNTER — Ambulatory Visit: Payer: BLUE CROSS/BLUE SHIELD | Admitting: Certified Nurse Midwife

## 2019-07-30 ENCOUNTER — Ambulatory Visit: Payer: BLUE CROSS/BLUE SHIELD | Admitting: Certified Nurse Midwife

## 2019-08-17 ENCOUNTER — Other Ambulatory Visit: Payer: Self-pay | Admitting: Hematology

## 2019-08-17 DIAGNOSIS — D72820 Lymphocytosis (symptomatic): Secondary | ICD-10-CM

## 2019-08-17 NOTE — Progress Notes (Signed)
[place] OFFICE PROGRESS NOTE  Patient Care Team: Josetta Huddle, MD as PCP - General (Internal Medicine)  HEME/ONC OVERVIEW: 1. Relative lymphocytosis -Previous patient of Dr. Audelia Hives and [facility]  -07/2018: mild lymphocyte predominance on CBC (lymphocytes 54%; normal CBC otherwise)  No monoclonal population on PB flow cytometry; inverted CD4:CD8 ratio, possibly due to infection  CT CAP in 01/2019 negative for lymphadenopathy or malignancy   2. Incidental thyroid nodule  -Incidental 1cm exophytic thyroid nodule anterior to the trachea on CT in 01/2019  ASSESSMENT & PLAN:   Relative lymphocytosis -I reviewed the patient's records in detail, including previous hematology clinic notes by Dr. Audelia Hives and [facility] -I also independently reviewed the radiologic images of recent CT CAP, I agree with findings documented -Briefly, patient presented in 07/2019 for mild relative lymphocyte predominance on CBC (lymphocyte 54% with normal WBC, Hgb, and platelet count).  Flow cytometry of the peripheral blood showed no evidence of monoclonal population, but did demonstrate inverted CD4:CD8 ratio, possibly due to infection.  CT CAP was done 01/2019, which was negative for any lymphadenopathy or malignancy. -I reviewed imaging results in detail with the patient -Given the inverted CD4:CD8 ratio on recent flow cytometry, I have ordered infectious studies, including HIV, Hep B/C serologies, to rule out any infection -CBC normal today, including lymphocyte percentage -I discussed with the patient that given the extensive negative work-up, including CT CAP, the likelihood of lymphoproliferative disease, is unlikely -As she had some lymphadenitis around the time of her initial abnormal CBC, which has resolved, her transient lymphocytosis is most likely due to a subclinical viral infection  -I counseled the patient on the importance of keeping up-to-date with age-appropriate cancer screening, including  colonoscopy, mammogram, and pelvic exam  Thyroid nodule -CT noted an incidental 1cm thyroid nodule -No abnormal thyroid gland on exam -I provided the patient a copy of the CT results, and recommended her to follow up with her PCP for thyroid ultrasound for further evaluation, especially given her family hx of thyroid cancer  Orders Placed This Encounter  Procedures  . Hepatitis B surface antibody,qualitative   All questions were answered. The patient knows to call the clinic with any problems, questions or concerns. No barriers to learning was detected.  A total of more than 25 minutes were spent face-to-face with the patient during this encounter and over half of that time was spent on counseling and coordination of care as outlined above.   Return as needed.   Tish Men, MD 08/19/2019 11:46 AM  CHIEF COMPLAINT: "I am feeling fine"  INTERVAL HISTORY: Vickie Perry returns to clinic for follow-up of history of lymphocytosis.  Patient reports that since last visit, she has been doing very well, and denies any constitutional symptoms.  She recalls that around the time of her initial abnormal CBC, she had some slightly swollen lymph nodes in her neck, which resolved without any intervention.  Since then, she has not had any recurrent enlarged lymph nodes or other suspicious symptoms.  She denies any complaint today.  REVIEW OF SYSTEMS:   Constitutional: ( - ) fevers, ( - )  chills , ( - ) night sweats Eyes: ( - ) blurriness of vision, ( - ) double vision, ( - ) watery eyes Ears, nose, mouth, throat, and face: ( - ) mucositis, ( - ) sore throat Respiratory: ( - ) cough, ( - ) dyspnea, ( - ) wheezes Cardiovascular: ( - ) palpitation, ( - ) chest discomfort, ( - )  lower extremity swelling Gastrointestinal:  ( - ) nausea, ( - ) heartburn, ( - ) change in bowel habits Skin: ( - ) abnormal skin rashes Lymphatics: ( - ) new lymphadenopathy, ( - ) easy bruising Neurological: ( - ) numbness, ( -  ) tingling, ( - ) new weaknesses Behavioral/Psych: ( - ) mood change, ( - ) new changes  All other systems were reviewed with the patient and are negative.  SUMMARY OF ONCOLOGIC HISTORY: Oncology History   No history exists.    I have reviewed the past medical history, past surgical history, social history and family history with the patient and they are unchanged from previous note.  ALLERGIES:  is allergic to erythromycin and erythromycin base.  MEDICATIONS:  Current Outpatient Medications  Medication Sig Dispense Refill  . albuterol (PROVENTIL HFA;VENTOLIN HFA) 108 (90 BASE) MCG/ACT inhaler Inhale 2 puffs into the lungs every 4 (four) hours as needed for wheezing or shortness of breath.     Marland Kitchen aspirin 81 MG tablet Take 81 mg by mouth daily.    . Clindamycin-Benzoyl Per, Refr, gel Reported on 11/17/2015  6  . clonazePAM (KLONOPIN) 1 MG tablet Take 1 mg by mouth daily.    . phentermine (ADIPEX-P) 37.5 MG tablet Take 37.5 mg by mouth daily.  1  . QVAR 80 MCG/ACT inhaler Inhale 2 puffs into the lungs as needed.   5  . rosuvastatin (CRESTOR) 10 MG tablet Take 5 mg by mouth daily.    . sertraline (ZOLOFT) 50 MG tablet take 1/2 daily  1  . tretinoin (RETIN-A) 0.05 % cream   9  . triamcinolone cream (KENALOG) 0.1 % Apply 1 application topically as needed. Reported on 11/17/2015    . valACYclovir (VALTREX) 1000 MG tablet Take 1 tablet (1,000 mg total) by mouth 2 (two) times daily. Take for 3 days as needed for an outbreak. 30 tablet 2  . valsartan (DIOVAN) 160 MG tablet Take 80 mg by mouth daily.     No current facility-administered medications for this visit.     PHYSICAL EXAMINATION: ECOG PERFORMANCE STATUS: 0 - Asymptomatic  Today's Vitals   08/19/19 1113 08/19/19 1123  BP: 125/78   Pulse: 81   Resp: 17   Temp: 98.3 F (36.8 C)   TempSrc: Temporal   SpO2: 98%   Height: 5' 6.25" (1.683 m)   PainSc:  0-No pain   Body mass index is 32.15 kg/m.  Filed Weights    GENERAL:  alert, no distress and comfortable SKIN: skin color, texture, turgor are normal, no rashes or significant lesions EYES: conjunctiva are pink and non-injected, sclera clear OROPHARYNX: no exudate, no erythema; lips, buccal mucosa, and tongue normal  NECK: supple, non-tender LYMPH:  no palpable lymphadenopathy in the cervical LUNGS: clear to auscultation with normal breathing effort HEART: regular rate & rhythm and no murmurs and no lower extremity edema ABDOMEN: soft, non-tender, non-distended, normal bowel sounds Musculoskeletal: no cyanosis of digits and no clubbing  PSYCH: alert & oriented x 3, fluent speech NEURO: no focal motor/sensory deficits  LABORATORY DATA:  I have reviewed the data as listed    Component Value Date/Time   NA 139 08/19/2019 1045   K 4.3 08/19/2019 1045   CL 104 08/19/2019 1045   CO2 24 08/19/2019 1045   GLUCOSE 102 (H) 08/19/2019 1045   BUN 13 08/19/2019 1045   CREATININE 0.90 08/19/2019 1045   CREATININE 0.84 03/27/2016 1535   CALCIUM 9.6 08/19/2019 1045   PROT  7.8 08/19/2019 1045   ALBUMIN 4.3 08/19/2019 1045   AST 27 08/19/2019 1045   ALT 27 08/19/2019 1045   ALKPHOS 77 08/19/2019 1045   BILITOT 0.6 08/19/2019 1045   GFRNONAA >60 08/19/2019 1045   GFRAA >60 08/19/2019 1045    No results found for: SPEP, UPEP  Lab Results  Component Value Date   WBC 6.4 08/19/2019   NEUTROABS 3.1 08/19/2019   HGB 13.9 08/19/2019   HCT 41.6 08/19/2019   MCV 87.4 08/19/2019   PLT 303 08/19/2019      Chemistry      Component Value Date/Time   NA 139 08/19/2019 1045   K 4.3 08/19/2019 1045   CL 104 08/19/2019 1045   CO2 24 08/19/2019 1045   BUN 13 08/19/2019 1045   CREATININE 0.90 08/19/2019 1045   CREATININE 0.84 03/27/2016 1535      Component Value Date/Time   CALCIUM 9.6 08/19/2019 1045   ALKPHOS 77 08/19/2019 1045   AST 27 08/19/2019 1045   ALT 27 08/19/2019 1045   BILITOT 0.6 08/19/2019 1045

## 2019-08-18 NOTE — Progress Notes (Signed)
58 y.o. [id number] Married  Caucasian Fe here for annual exam. Menopausal no HRT. Having some vaginal dryness and using Olive oil with good results. Occasional HSV outbreaks, needs Valtrex update. Followed up with PCP regarding enlarged lymph nodes noted here and did have some elevation of lymphocytes for about 7 months. Also noted small thyroid nodule with [place] of lymph node, being followed by PCP. No treatment needed at present. Hypertension, cholesterol, reflux with PCP management. All stable per patient. Requests pap smear yearly. No other health issues today.  Patient's last menstrual period was 03/17/2013 (exact date).          Sexually active: Yes.    The current method of family planning is vasectomy.    Exercising: Yes.    walking Smoker:  no  Review of Systems  Constitutional: Negative.   HENT: Negative.   Eyes: Negative.   Respiratory: Negative.   Cardiovascular: Negative.   Gastrointestinal: Negative.   Genitourinary: Negative.   Musculoskeletal: Negative.   Skin: Negative.   Neurological: Negative.   Endo/Heme/Allergies: Negative.   Psychiatric/Behavioral: Negative.     Health Maintenance: Pap:  03-22-15 neg HPV HR neg, 06-12-17 neg, 07-23-18 neg HPV HR neg History of Abnormal Pap: no MMG:  09-04-18 category b density birads 2:neg Self Breast exams: no Colonoscopy:  2015 f/u 90yrs BMD:   none TDaP:  2017 Shingles:  2020 Pneumonia: not done Hep C and HIV: both neg 2017 Labs: PCP   reports that she has never smoked. She has never used smokeless tobacco. She reports current alcohol use of about 3.0 standard drinks of alcohol per week. She reports that she does not use drugs.  Past Medical History:  Diagnosis Date  . Allergic rhinitis   . Anxiety   . Asthma   . Depression    2005  . Dyslipidemia   . Edema in pregnancy   . Exercise induced bronchospasm   . Family history of early CAD 07/04/2017  . HSV-2 (herpes simplex virus 2) infection   . Hypertension   .  Hypophysitis ([place])    H/O Autoimmune  . Mitral regurgitation   . Palpitations   . Panic attack   . Perennial allergic rhinitis   . PIH (pregnancy induced hypertension)    P partum// severe   . Tonsillar calculus 05/2012   Dr Orpah Greek bates  . Torn meniscus     Past Surgical History:  Procedure Laterality Date  . ABLATION  2007   HTA  . CESAREAN SECTION     x 2// Due to HSV2  . FOOT SURGERY    . KNEE SURGERY Left 2019  . plastic eye surgery      Current Outpatient Medications  Medication Sig Dispense Refill  . albuterol (PROVENTIL HFA;VENTOLIN HFA) 108 (90 BASE) MCG/ACT inhaler Inhale 2 puffs into the lungs every 4 (four) hours as needed for wheezing or shortness of breath.     Marland Kitchen aspirin 81 MG tablet Take 81 mg by mouth daily.    . Clindamycin-Benzoyl Per, Refr, gel Reported on 11/17/2015  6  . clonazePAM (KLONOPIN) 1 MG tablet Take 1 mg by mouth daily.    . fluticasone (FLONASE) 50 MCG/ACT nasal spray 1 2 SPRAY IN EACH NOSTRIL TWICE A DAY NASALLY 30 DAYS    . ipratropium (ATROVENT) 0.06 % nasal spray Place 2 sprays into both nostrils 2 (two) times daily.    Marland Kitchen levocetirizine (XYZAL) 5 MG tablet Take 5 mg by mouth at bedtime.    Marland Kitchen  metoprolol succinate (TOPROL-XL) 25 MG 24 hr tablet 1 TABLET AS NEEDED FOR PALPITAITONS ORALLY 90 DAYS    . phentermine (ADIPEX-P) 37.5 MG tablet Take 37.5 mg by mouth daily.  1  . QVAR 80 MCG/ACT inhaler Inhale 2 puffs into the lungs as needed.   5  . rosuvastatin (CRESTOR) 10 MG tablet Take 5 mg by mouth daily.    . sertraline (ZOLOFT) 50 MG tablet take 1/2 daily  1  . tretinoin (RETIN-A) 0.05 % cream   9  . triamcinolone cream (KENALOG) 0.1 % Apply 1 application topically as needed. Reported on 11/17/2015    . valACYclovir (VALTREX) 1000 MG tablet Take 1 tablet (1,000 mg total) by mouth 2 (two) times daily. Take for 3 days as needed for an outbreak. 30 tablet 2  . valsartan (DIOVAN) 160 MG tablet Take 80 mg by mouth daily.    Marland Kitchen VITAMIN D PO Take 2,000  Int'l Units by mouth daily.     No current facility-administered medications for this visit.     Family History  Problem Relation Age of Onset  . Prostate cancer Father   . Heart disease Father   . Hyperlipidemia Brother   . Heart disease Mother   . Heart disease Maternal Grandfather   . Heart disease Brother   . Hyperlipidemia Brother   . Heart disease Brother   . Hyperlipidemia Brother   . Hyperlipidemia Brother   . Hyperlipidemia Brother     ROS:  Pertinent items are noted in HPI.  Otherwise, a comprehensive ROS was negative.  Exam:   BP 122/78   Pulse 70   Temp (!) 97.2 F (36.2 C) (Skin)   Resp 16   Ht 5' 6.25" (1.683 m)   LMP 03/17/2013 (Exact Date)   BMI 32.15 kg/m  Height: 5' 6.25" (168.3 cm) Ht Readings from Last 3 Encounters:  08/19/19 5' 6.25" (1.683 m)  08/19/19 5' 6.25" (1.683 m)  12/18/18 5' 6.25" (1.683 m)    General appearance: alert, cooperative and appears stated age Head: Normocephalic, without obvious abnormality, atraumatic Neck: no adenopathy, supple, symmetrical, trachea midline and thyroid small nodule noted on right Lungs: clear to auscultation bilaterally Breasts: normal appearance, no masses or tenderness, No nipple retraction or dimpling, No nipple discharge or bleeding, No axillary or supraclavicular adenopathy Heart: regular rate and rhythm Abdomen: soft, non-tender; no masses,  no organomegaly Extremities: extremities normal, atraumatic, no cyanosis llor edema Skin: Skin color, texture, turgor normal. No rashes or lesions Lymph nodes: Cervical, supraclavicular, and axillary nodes normal. No abnormal inguinal nodes palpated Neurologic: Grossly normal   Pelvic: External genitalia:  no lesions              Urethra:  normal appearing urethra with no masses, tenderness or lesions              Bartholin's and Skene's: normal                 Vagina: normal appearing vagina with normal color and discharge, no lesions              Cervix:  no cervical motion tenderness, no lesions and normal appearance, retroverted              Pap taken: Yes.   Bimanual Exam:  Uterus:  normal size, contour, position, consistency, mobility, non-tender and anteverted              Adnexa: normal adnexa and no mass, fullness, tenderness  Rectovaginal: Confirms               Anus:  normal sphincter tone, no lesions  Chaperone present: yes  A:  Well Woman with normal exam  Menopausal no HRT  Cervical lymph node, hypertension, cholesterol, labs with PCP management. All stable.  History of HSV 2 needs update on Rx  Request pap yearl  P:   Reviewed health and wellness pertinent to exam  Aware of need to advise if vaginal bleeding.  Continue follow up with PCP as indicated.  Rx Valtrex see order with instructions  Pap smear: yes   counseled on breast self exam, mammography screening, feminine hygiene, menopause, adequate intake of calcium and vitamin D, diet and exercise  return annually or prn  An After Visit Summary was printed and given to the patient.

## 2019-08-19 ENCOUNTER — Inpatient Hospital Stay (HOSPITAL_BASED_OUTPATIENT_CLINIC_OR_DEPARTMENT_OTHER): Payer: Managed Care, Other (non HMO) | Admitting: Hematology

## 2019-08-19 ENCOUNTER — Encounter: Payer: Self-pay | Admitting: Certified Nurse Midwife

## 2019-08-19 ENCOUNTER — Ambulatory Visit: Payer: Managed Care, Other (non HMO) | Admitting: Certified Nurse Midwife

## 2019-08-19 ENCOUNTER — Inpatient Hospital Stay: Payer: Managed Care, Other (non HMO) | Attending: Hematology

## 2019-08-19 ENCOUNTER — Encounter: Payer: Self-pay | Admitting: Hematology

## 2019-08-19 ENCOUNTER — Other Ambulatory Visit (HOSPITAL_COMMUNITY)
Admission: RE | Admit: 2019-08-19 | Discharge: 2019-08-19 | Disposition: A | Discharge status: Home / Self Care | Payer: Managed Care, Other (non HMO) | Source: Ambulatory Visit | Attending: Obstetrics & Gynecology | Admitting: Obstetrics & Gynecology

## 2019-08-19 ENCOUNTER — Other Ambulatory Visit: Payer: Self-pay

## 2019-08-19 VITALS — BP 125/78 | HR 81 | Temp 98.3°F | Resp 17 | Ht 66.25 in

## 2019-08-19 VITALS — BP 122/78 | HR 70 | Temp 97.2°F | Resp 16 | Ht 66.25 in

## 2019-08-19 DIAGNOSIS — Z8619 Personal history of other infectious and parasitic diseases: Secondary | ICD-10-CM | POA: Diagnosis not present

## 2019-08-19 DIAGNOSIS — E041 Nontoxic single thyroid nodule: Secondary | ICD-10-CM | POA: Diagnosis not present

## 2019-08-19 DIAGNOSIS — Z01419 Encounter for gynecological examination (general) (routine) without abnormal findings: Secondary | ICD-10-CM | POA: Diagnosis not present

## 2019-08-19 DIAGNOSIS — D72820 Lymphocytosis (symptomatic): Secondary | ICD-10-CM

## 2019-08-19 DIAGNOSIS — Z85850 Personal history of malignant neoplasm of thyroid: Secondary | ICD-10-CM | POA: Insufficient documentation

## 2019-08-19 DIAGNOSIS — Z124 Encounter for screening for malignant neoplasm of cervix: Secondary | ICD-10-CM | POA: Diagnosis present

## 2019-08-19 DIAGNOSIS — Z79899 Other long term (current) drug therapy: Secondary | ICD-10-CM | POA: Insufficient documentation

## 2019-08-19 LAB — CBC WITH DIFFERENTIAL (CANCER CENTER ONLY)
Abs Immature Granulocytes: 0.02 10*3/uL (ref 0.00–0.07)
Basophils Absolute: 0 10*3/uL (ref 0.0–0.1)
Basophils Relative: 1 %
Eosinophils Absolute: 0.2 10*3/uL (ref 0.0–0.5)
Eosinophils Relative: 3 %
HCT: 41.6 % (ref 36.0–46.0)
Hemoglobin: 13.9 g/dL (ref 12.0–15.0)
Immature Granulocytes: 0 %
Lymphocytes Relative: 41 %
Lymphs Abs: 2.6 10*3/uL (ref 0.7–4.0)
MCH: 29.2 pg (ref 26.0–34.0)
MCHC: 33.4 g/dL (ref 30.0–36.0)
MCV: 87.4 fL (ref 80.0–100.0)
Monocytes Absolute: 0.4 10*3/uL (ref 0.1–1.0)
Monocytes Relative: 6 %
Neutro Abs: 3.1 10*3/uL (ref 1.7–7.7)
Neutrophils Relative %: 49 %
Platelet Count: 303 10*3/uL (ref 150–400)
RBC: 4.76 MIL/uL (ref 3.87–5.11)
RDW: 12 % (ref 11.5–15.5)
WBC Count: 6.4 10*3/uL (ref 4.0–10.5)
nRBC: 0 % (ref 0.0–0.2)

## 2019-08-19 LAB — CMP (CANCER CENTER ONLY)
ALT: 27 U/L (ref 0–44)
AST: 27 U/L (ref 15–41)
Albumin: 4.3 g/dL (ref 3.5–5.0)
Alkaline Phosphatase: 77 U/L (ref 38–126)
Anion gap: 11 (ref 5–15)
BUN: 13 mg/dL (ref 6–20)
CO2: 24 mmol/L (ref 22–32)
Calcium: 9.6 mg/dL (ref 8.9–10.3)
Chloride: 104 mmol/L (ref 98–111)
Creatinine: 0.9 mg/dL (ref 0.44–1.00)
GFR, Est AFR Am: >60 mL/min (ref >60)
GFR, Estimated: >60 mL/min (ref >60)
Glucose, Bld: 102 mg/dL — ABNORMAL HIGH (ref 70–99)
Potassium: 4.3 mmol/L (ref 3.5–5.1)
Sodium: 139 mmol/L (ref 135–145)
Total Bilirubin: 0.6 mg/dL (ref 0.3–1.2)
Total Protein: 7.8 g/dL (ref 6.5–8.1)

## 2019-08-19 LAB — HEPATITIS B SURFACE ANTIBODY,QUALITATIVE: Hep B S Ab: REACTIVE — AB

## 2019-08-19 LAB — TSH: TSH: 0.461 u[IU]/mL (ref 0.308–3.960)

## 2019-08-19 LAB — HIV ANTIBODY (ROUTINE TESTING W REFLEX): HIV Screen 4th Generation wRfx: NONREACTIVE

## 2019-08-19 LAB — HEPATITIS B CORE ANTIBODY, TOTAL: Hep B Core Total Ab: NONREACTIVE

## 2019-08-19 LAB — LACTATE DEHYDROGENASE: LDH: 188 U/L (ref 98–192)

## 2019-08-19 LAB — SAVE SMEAR (SSMR)

## 2019-08-19 LAB — HEPATITIS B SURFACE ANTIGEN: Hepatitis B Surface Ag: NONREACTIVE

## 2019-08-19 MED ORDER — VALACYCLOVIR HCL 1 G PO TABS: 1000.0000 mg | ORAL_TABLET | Freq: Two times a day (BID) | ORAL | 12 refills | Status: DC

## 2019-08-19 NOTE — Patient Instructions (Signed)

## 2019-08-20 ENCOUNTER — Telehealth: Payer: Self-pay | Admitting: Hematology

## 2019-08-20 LAB — HEPATITIS C ANTIBODY (REFLEX): HCV Ab: <0.1 s/co ratio (ref 0.0–0.9)

## 2019-08-20 LAB — CYTOLOGY - PAP: Diagnosis: NEGATIVE

## 2019-08-20 LAB — HCV COMMENT:

## 2019-08-20 NOTE — Telephone Encounter (Signed)
Per 10/14 los return as needed

## 2019-09-07 ENCOUNTER — Encounter: Payer: Self-pay | Admitting: Certified Nurse Midwife

## 2019-09-15 NOTE — Progress Notes (Signed)
Cardiology Office Note:    Date:  09/16/2019   ID:  Vickie Perry, DOB August 08, 1961, MRN [id number]  PCP:  Josetta Huddle, MD  Cardiologist:  No primary care provider on file.    Referring MD: Josetta Huddle, MD   Chief Complaint  Patient presents with  . Follow-up    MR, AR, HTN    History of Present Illness:    Vickie Perry is a 58 y.o. female with a hx of mild MR/AR/PR, HTN, family history of CAD with coronary calcium score of 0 and history of palpitations with no significant arrhythmias on heart monitor.  He is here today for followup and is doing well.  He denies any chest pain or pressure, SOB, DOE, PND, orthopnea, LE edema, dizziness, palpitations or syncope. He is compliant with his meds and is tolerating meds with no SE.    Past Medical History:  Diagnosis Date  . Allergic rhinitis   . Anxiety   . Asthma   . Depression    2005  . Dyslipidemia   . Edema in pregnancy   . Exercise induced bronchospasm   . Family history of early CAD 07/04/2017  . HSV-2 (herpes simplex virus 2) infection   . Hypertension   . Hypophysitis ([place])    H/O Autoimmune  . Mitral regurgitation   . Palpitations   . Panic attack   . Perennial allergic rhinitis   . PIH (pregnancy induced hypertension)    P partum// severe   . Tonsillar calculus 05/2012   Dr Orpah Greek bates  . Torn meniscus     Past Surgical History:  Procedure Laterality Date  . ABLATION  2007   HTA  . CESAREAN SECTION     x 2// Due to HSV2  . FOOT SURGERY    . KNEE SURGERY Left 2019  . plastic eye surgery      Current Medications: Current Meds  Medication Sig  . albuterol (PROVENTIL HFA;VENTOLIN HFA) 108 (90 BASE) MCG/ACT inhaler Inhale 2 puffs into the lungs every 4 (four) hours as needed for wheezing or shortness of breath.   Marland Kitchen aspirin 81 MG tablet Take 81 mg by mouth daily.  . Clindamycin-Benzoyl Per, Refr, gel Reported on 11/17/2015  . clonazePAM (KLONOPIN) 1 MG tablet Take 1 mg by mouth daily.   . fluticasone (FLONASE) 50 MCG/ACT nasal spray 1 2 SPRAY IN EACH NOSTRIL TWICE A DAY NASALLY 30 DAYS  . ipratropium (ATROVENT) 0.06 % nasal spray Place 2 sprays into both nostrils 2 (two) times daily.  Marland Kitchen levocetirizine (XYZAL) 5 MG tablet Take 5 mg by mouth at bedtime.  . metoprolol succinate (TOPROL-XL) 25 MG 24 hr tablet 1 TABLET AS NEEDED FOR PALPITAITONS ORALLY 90 DAYS  . phentermine (ADIPEX-P) 37.5 MG tablet Take 37.5 mg by mouth daily.  Marland Kitchen QVAR 80 MCG/ACT inhaler Inhale 2 puffs into the lungs as needed.   . rosuvastatin (CRESTOR) 10 MG tablet Take 5 mg by mouth daily.  . sertraline (ZOLOFT) 50 MG tablet take 1/2 daily  . tretinoin (RETIN-A) 0.05 % cream   . triamcinolone cream (KENALOG) 0.1 % Apply 1 application topically as needed. Reported on 11/17/2015  . valACYclovir (VALTREX) 1000 MG tablet Take 1 tablet (1,000 mg total) by mouth 2 (two) times daily. Take for 3 days as needed for an outbreak.  . valsartan (DIOVAN) 160 MG tablet Take 80 mg by mouth daily.  Marland Kitchen VITAMIN D PO Take 2,000 Int'l Units by mouth daily.     Allergies:  Erythromycin   Social History   Socioeconomic History  . Marital status: Married    Spouse name: Not on file  . Number of children: Not on file  . Years of education: Not on file  . Highest education level: Not on file  Occupational History  . Not on file  Social Needs  . Financial resource strain: Not on file  . Food insecurity    Worry: Not on file    Inability: Not on file  . Transportation needs    Medical: Not on file    Non-medical: Not on file  Tobacco Use  . Smoking status: Never Smoker  . Smokeless tobacco: Never Used  Substance and Sexual Activity  . Alcohol use: Yes    Alcohol/week: 3.0 standard drinks    Types: 3 Standard drinks or equivalent per week  . Drug use: No  . Sexual activity: Yes    Partners: Male    Birth control/protection: Other-see comments, Post-menopausal    Comment: vasectomy  Lifestyle  . Physical activity     Days per week: Not on file    Minutes per session: Not on file  . Stress: Not on file  Relationships  . Social Herbalist on phone: Not on file    Gets together: Not on file    Attends religious service: Not on file    Active member of club or organization: Not on file    Attends meetings of clubs or organizations: Not on file    Relationship status: Not on file  Other Topics Concern  . Not on file  Social History Narrative  . Not on file     Family History: The patient's family history includes Heart disease in her brother, brother, father, maternal grandfather, and mother; Hyperlipidemia in her brother, brother, brother, brother, and brother; Prostate cancer in her father.  ROS:   Please see the history of present illness.    ROS  All other systems reviewed and negative.   EKGs/Labs/Other Studies Reviewed:    The following studies were reviewed today: none  EKG:  EKG is  ordered today.  The ekg ordered today demonstrates NSR with no ST changes  Recent Labs: 08/19/2019: ALT 27; BUN 13; Creatinine 0.90; Hemoglobin 13.9; Platelet Count 303; Potassium 4.3; Sodium 139; TSH 0.461   Recent Lipid Panel    Component Value Date/Time   CHOL 172 03/27/2016 1535   TRIG 164 (H) 03/27/2016 1535   HDL 58 03/27/2016 1535   CHOLHDL 3.0 03/27/2016 1535   VLDL 33 (H) 03/27/2016 1535   LDLCALC 81 03/27/2016 1535    Physical Exam:    VS:  BP 120/68   Pulse 82   Ht 5' 6.25" (1.683 m)   LMP 03/17/2013 (Exact Date)   BMI 32.15 kg/m     Wt Readings from Last 3 Encounters:  09/24/18 200 lb 11.2 oz (91 kg)  09/10/18 197 lb (89.4 kg)  08/18/18 197 lb (89.4 kg)     GEN:  Well nourished, well developed in no acute distress HEENT: Normal NECK: No JVD; No carotid bruits LYMPHATICS: No lymphadenopathy CARDIAC: RRR, no murmurs, rubs, gallops RESPIRATORY:  Clear to auscultation without rales, wheezing or rhonchi  ABDOMEN: Soft, non-tender, non-distended MUSCULOSKELETAL:   No edema; No deformity  SKIN: Warm and dry NEUROLOGIC:  Alert and oriented x 3 PSYCHIATRIC:  Normal affect   ASSESSMENT:    1. Nonrheumatic mitral valve regurgitation   2. Benign essential HTN   3.  Palpitations    PLAN:    In order of problems listed above:  1. Mitral regurgitation -trivial on echo 2018  2.  HTN -BP controlled  -continue Valsartan 160mg  daily   3.  Palpitations -she has not had any palpitations -she has not used an BB for breakthrough recently   Medication Adjustments/Labs and Tests Ordered: Current medicines are reviewed at length with the patient today.  Concerns regarding medicines are outlined above.  No orders of the defined types were placed in this encounter.  No orders of the defined types were placed in this encounter.   Signed, Fransico Him, MD  09/16/2019 10:19 AM    [place]

## 2019-09-16 ENCOUNTER — Other Ambulatory Visit: Payer: Self-pay

## 2019-09-16 ENCOUNTER — Encounter: Payer: Self-pay | Admitting: Cardiology

## 2019-09-16 ENCOUNTER — Ambulatory Visit: Payer: BLUE CROSS/BLUE SHIELD | Admitting: Cardiology

## 2019-09-16 VITALS — BP 120/68 | HR 82 | Ht 66.25 in

## 2019-09-16 DIAGNOSIS — I10 Essential (primary) hypertension: Secondary | ICD-10-CM

## 2019-09-16 DIAGNOSIS — R002 Palpitations: Secondary | ICD-10-CM | POA: Diagnosis not present

## 2019-09-16 DIAGNOSIS — I340 Nonrheumatic mitral (valve) insufficiency: Secondary | ICD-10-CM | POA: Diagnosis not present

## 2019-09-16 NOTE — Patient Instructions (Signed)
Medication Instructions:   Your physician recommends that you continue on your current medications as directed. Please refer to the Current Medication list given to you today.  *If you need a refill on your cardiac medications before your next appointment, please call your pharmacy*    Follow-Up: At [facility] you and your health needs are our priority.  As part of our continuing mission to provide you with exceptional heart care, we have created designated Provider Care Teams.  These Care Teams include your primary Cardiologist (physician) and Advanced Practice Providers (APPs -  Physician Assistants and Nurse Practitioners) who all work together to provide you with the care you need, when you need it.  Your next appointment:   12 months  The format for your next appointment:   Either In Person or Virtual  Provider:   Fransico Him, MD

## 2019-10-05 ENCOUNTER — Other Ambulatory Visit: Payer: Self-pay | Admitting: Internal Medicine

## 2019-10-05 DIAGNOSIS — E041 Nontoxic single thyroid nodule: Secondary | ICD-10-CM

## 2019-10-12 ENCOUNTER — Ambulatory Visit
Admission: RE | Admit: 2019-10-12 | Discharge: 2019-10-12 | Disposition: A | Discharge status: Home / Self Care | Payer: Managed Care, Other (non HMO) | Source: Ambulatory Visit | Attending: Internal Medicine | Admitting: Internal Medicine

## 2019-10-12 DIAGNOSIS — E041 Nontoxic single thyroid nodule: Secondary | ICD-10-CM

## 2019-11-24 ENCOUNTER — Telehealth: Payer: Self-pay | Admitting: Unknown Physician Specialty

## 2019-11-24 ENCOUNTER — Other Ambulatory Visit: Payer: Self-pay | Admitting: Unknown Physician Specialty

## 2019-11-24 DIAGNOSIS — I10 Essential (primary) hypertension: Secondary | ICD-10-CM

## 2019-11-24 DIAGNOSIS — U071 COVID-19: Secondary | ICD-10-CM

## 2019-11-24 NOTE — Telephone Encounter (Signed)
Connected with pt r/e monoclonal antibody.  She wanted me to call back

## 2019-11-24 NOTE — Telephone Encounter (Signed)
  I connected by phone with Vickie Perry on 11/24/2019 at 5:11 PM to discuss the potential use of an new treatment for mild to moderate COVID-19 viral infection in non-hospitalized patients.  This patient is a 59 y.o. female that meets the FDA criteria for Emergency Use Authorization of bamlanivimab or casirivimab\imdevimab.  Has a (+) direct SARS-CoV-2 viral test result  Has mild or moderate COVID-19   Is ? 57 years of age and weighs ? 40 kg  Is NOT hospitalized due to COVID-19  Is NOT requiring oxygen therapy or requiring an increase in baseline oxygen flow rate due to COVID-19  Is within 10 days of symptom onset  Has at least one of the high risk factor(s) for progression to severe COVID-19 and/or hospitalization as defined in EUA.  Specific high risk criteria : Hypertension and over the age of 5   I have spoken and communicated the following to the patient or parent/caregiver:  1. FDA has authorized the emergency use of bamlanivimab and casirivimab\imdevimab for the treatment of mild to moderate COVID-19 in adults and pediatric patients with positive results of direct SARS-CoV-2 viral testing who are 43 years of age and older weighing at least 40 kg, and who are at high risk for progressing to severe COVID-19 and/or hospitalization.  2. The significant known and potential risks and benefits of bamlanivimab and casirivimab\imdevimab, and the extent to which such potential risks and benefits are unknown.  3. Information on available alternative treatments and the risks and benefits of those alternatives, including clinical trials.  4. Patients treated with bamlanivimab and casirivimab\imdevimab should continue to self-isolate and use infection control measures (e.g., wear mask, isolate, social distance, avoid sharing personal items, clean and disinfect "high touch" surfaces, and frequent handwashing) according to CDC guidelines.   5. The patient or parent/caregiver has the  option to accept or refuse bamlanivimab or casirivimab\imdevimab .  After reviewing this information with the patient, The patient agreed to proceed with receiving the bamlanimivab infusion and will be provided a copy of the Fact sheet prior to receiving the infusion.Kathrine Haddock 11/24/2019 5:11 PM Sx onset 1/15

## 2019-11-27 ENCOUNTER — Ambulatory Visit (HOSPITAL_COMMUNITY)
Admission: RE | Admit: 2019-11-27 | Discharge: 2019-11-27 | Disposition: A | Discharge status: Home / Self Care | Payer: Managed Care, Other (non HMO) | Source: Ambulatory Visit | Attending: Pulmonary Disease | Admitting: Pulmonary Disease

## 2019-11-27 DIAGNOSIS — U071 COVID-19: Secondary | ICD-10-CM | POA: Insufficient documentation

## 2019-11-27 DIAGNOSIS — I10 Essential (primary) hypertension: Secondary | ICD-10-CM | POA: Diagnosis present

## 2019-11-27 DIAGNOSIS — Z23 Encounter for immunization: Secondary | ICD-10-CM | POA: Diagnosis not present

## 2019-11-27 MED ORDER — SODIUM CHLORIDE 0.9 % IV SOLN
700.0000 mg | Freq: Once | INTRAVENOUS | Status: AC
  Administered 2019-11-27: 700 mg via INTRAVENOUS
  Filled 2019-11-27: qty 20

## 2019-11-27 MED ORDER — EPINEPHRINE 0.3 MG/0.3ML IJ SOAJ: 0.3000 mg | Freq: Once | INTRAMUSCULAR | Status: DC | PRN

## 2019-11-27 MED ORDER — ALBUTEROL SULFATE HFA 108 (90 BASE) MCG/ACT IN AERS: 2.0000 | INHALATION_SPRAY | Freq: Once | RESPIRATORY_TRACT | Status: DC | PRN

## 2019-11-27 MED ORDER — SODIUM CHLORIDE 0.9 % IV SOLN
INTRAVENOUS | Status: DC | PRN
  Administered 2019-11-27: 250 mL via INTRAVENOUS

## 2019-11-27 MED ORDER — FAMOTIDINE IN NACL 20-0.9 MG/50ML-% IV SOLN: 20.0000 mg | Freq: Once | INTRAVENOUS | Status: DC | PRN

## 2019-11-27 MED ORDER — DIPHENHYDRAMINE HCL 50 MG/ML IJ SOLN: 50.0000 mg | Freq: Once | INTRAMUSCULAR | Status: DC | PRN

## 2019-11-27 MED ORDER — METHYLPREDNISOLONE SODIUM SUCC 125 MG IJ SOLR: 125.0000 mg | Freq: Once | INTRAMUSCULAR | Status: DC | PRN

## 2019-11-27 NOTE — Discharge Instructions (Signed)

## 2019-11-27 NOTE — Progress Notes (Signed)
  Diagnosis: COVID-19  Physician: Dr. Joya Gaskins  Procedure: [facility] Med: bamlanivimab infusion - Provided patient with bamlanimivab fact sheet for patients, parents and caregivers prior to infusion.  Complications: No immediate complications noted.  Discharge: Discharged home   Tia Masker 11/27/2019

## 2020-01-25 ENCOUNTER — Encounter: Payer: Self-pay | Admitting: Certified Nurse Midwife

## 2020-02-11 ENCOUNTER — Other Ambulatory Visit: Payer: Self-pay | Admitting: *Deleted

## 2020-02-11 DIAGNOSIS — Z8619 Personal history of other infectious and parasitic diseases: Secondary | ICD-10-CM

## 2020-02-11 NOTE — Telephone Encounter (Signed)
Medication refill request: Valtrex Last AEX:  08-19-2019 DL  Next AEX: not currently scheduled  Last MMG (if hormonal medication request): n/a Refill authorized: Today, please advise.   Medication pended for #30, 0RF. Please refill if appropriate.

## 2020-02-11 NOTE — Telephone Encounter (Signed)
Message left to return call to Yechiel Erny at [phone].    

## 2020-02-11 NOTE — Telephone Encounter (Signed)
Detailed message left per DPR advising patient to return call in regards to valtrex prescription request. Advised patient phone lines would be off after 1600, but message not urgent, could return call when office opens at 0800.

## 2020-02-11 NOTE — Telephone Encounter (Signed)
Typically the Valtrex script is for 500 mg BID x 3 days prn. Can you check with the patient if she has tried this dose before? If not I would refill at the 500 mg dose, if that hasn't worked for her than refill the 1000 mg dose

## 2020-02-15 MED ORDER — VALACYCLOVIR HCL 500 MG PO TABS: ORAL_TABLET | ORAL | 2 refills | Status: DC

## 2020-02-15 NOTE — Telephone Encounter (Signed)
Call to patient. Patient states that she has taken 500mg  of Valtrex in the past and actually prefers that dosage. RN advised would update Dr. Talbert Nan so that prescription could be sent in for patient.

## 2020-02-15 NOTE — Telephone Encounter (Signed)
I sent in the 500 mg dose

## 2020-02-27 ENCOUNTER — Other Ambulatory Visit: Payer: Self-pay | Admitting: Obstetrics and Gynecology

## 2020-03-03 ENCOUNTER — Telehealth: Payer: Self-pay | Admitting: *Deleted

## 2020-03-03 NOTE — Telephone Encounter (Signed)
Prior authorization for Tretinoin 0.05% cream done on Cover My Meds.

## 2020-03-07 ENCOUNTER — Other Ambulatory Visit: Payer: Self-pay | Admitting: Physician Assistant

## 2020-03-07 NOTE — Telephone Encounter (Signed)
Left message for patient to call us. Received message from pharmacy requesting refill for xyzal : when patient was here in February we gave patient hand script for xyzal : I was calling patient to inform her to take hand prescription to pharmacy.

## 2020-03-07 NOTE — Telephone Encounter (Signed)
Patient called to say that at this time she does not need a refill at this time on [place] did send in prescription so that it would be at pharmacy when she needed it.

## 2020-03-07 NOTE — Telephone Encounter (Signed)
Patient does still have the handwritten prescription and will take to pharmacy.

## 2020-03-31 ENCOUNTER — Other Ambulatory Visit: Payer: Self-pay | Admitting: Obstetrics and Gynecology

## 2020-03-31 NOTE — Telephone Encounter (Signed)
Medication refill request: Valtrex Last AEX:  08/19/19 DL Next AEX: none scheduled Last MMG (if hormonal medication request): n/a Refill authorized: Today, please advise.   Medication pended #30 w/0 refills if authorized

## 2020-03-31 NOTE — Telephone Encounter (Signed)
Left message to call Kaitlyn at [phone]. 

## 2020-03-31 NOTE — Telephone Encounter (Signed)
Please speak with the patient. Valtrex script was sent last month for prn use, #30 with 2 refills. She shouldn't need a refill. Please make sure she isn't taking it daily.

## 2020-04-05 NOTE — Telephone Encounter (Signed)
Spoke with patient. Patient states that she does not need a refill at this time. Patient will contact [facility] to take this medication off automatic refill request. Patient states that she has plenty of medication. Aex scheduled for patient on 08/24/2020 at 9:30 am with Dr.Jertson. patient is agreeable to date and time.  Routing to provider and will close encounter.

## 2020-08-23 NOTE — Progress Notes (Signed)
59 y.o. [id number] Married White or Caucasian Not Hispanic or Latino female here for annual exam.  Patient needs refills on Vitamin D and Valtrex.  She has bleed after sex intermittently for 3-5 years. She uses olive oil. She doesn't bleed any other time. Sometimes feels she is tearing. Notices a tinge of blood on the tissue after sex. Always a little uncomfortable afterwards.   H/O panic disorder, completely controlled on SSRI's.   H/O genital hsv, takes prn medication. In the last year she had a lot of outbreaks, but was under lots of stress.     Patient's last menstrual period was 03/17/2013 (exact date).          Sexually active: Yes.    The current method of family planning is post menopausal status.    Exercising: Yes.    Walking  Smoker:  no  Health Maintenance: Pap:  08/19/19 negative; 07-23-18 neg HPV HR neg History of abnormal Pap:  no MMG:  09/07/19 Density B Bi-rads 1 neg  BMD:   None  Colonoscopy: 2015 f/u10 years  TDaP:  2017 Gardasil: na   reports that she has never smoked. She has never used smokeless tobacco. She reports current alcohol use of about 3.0 standard drinks of alcohol per week. She reports that she does not use drugs.  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 Married x 32 years. Kids are 56 and 66. Has 2 grandchildren.   Past Medical History:  Diagnosis Date  . Allergic rhinitis   . Anxiety   . Asthma   . Depression    2005  . Dyslipidemia   . Edema in pregnancy   . Exercise induced bronchospasm   . Family history of early CAD 07/04/2017  . HSV-2 (herpes simplex virus 2) infection   . Hypertension   . Hypophysitis ([place])    H/O Autoimmune  . Mitral regurgitation   . Palpitations   . Panic attack   . Perennial allergic rhinitis   . PIH (pregnancy induced hypertension)    P partum// severe   . Tonsillar calculus 05/2012   Dr Orpah Greek bates  . Torn meniscus     Past Surgical History:  Procedure  Laterality Date  . ABLATION  2007   HTA  . CESAREAN SECTION     x 2// Due to HSV2  . FOOT SURGERY    . KNEE SURGERY Left 2019  . plastic eye surgery      Current Outpatient Medications  Medication Sig Dispense Refill  . albuterol (PROVENTIL HFA;VENTOLIN HFA) 108 (90 BASE) MCG/ACT inhaler Inhale 2 puffs into the lungs every 4 (four) hours as needed for wheezing or shortness of breath.     Marland Kitchen aspirin 81 MG tablet Take 81 mg by mouth daily.    . Clindamycin-Benzoyl Per, Refr, gel Reported on 11/17/2015  6  . clonazePAM (KLONOPIN) 1 MG tablet Take 1 mg by mouth daily.    . fluticasone (FLONASE) 50 MCG/ACT nasal spray 1 2 SPRAY IN EACH NOSTRIL TWICE A DAY NASALLY 30 DAYS    . ipratropium (ATROVENT) 0.06 % nasal spray Place 2 sprays into both nostrils 2 (two) times daily.    Marland Kitchen levocetirizine (XYZAL) 5 MG tablet Take 5 mg by mouth at bedtime.    Marland Kitchen QVAR 80 MCG/ACT inhaler Inhale 2 puffs into the lungs as needed.   5  . rosuvastatin (CRESTOR) 10 MG tablet Take 5 mg by mouth daily.    Marland Kitchen  sertraline (ZOLOFT) 50 MG tablet take 1/2 daily  1  . tretinoin (RETIN-A) 0.05 % cream   9  . triamcinolone cream (KENALOG) 0.1 % Apply 1 application topically as needed. Reported on 11/17/2015    . valACYclovir (VALTREX) 500 MG tablet Take one tablet po bid x 3 days prn 30 tablet 2  . valsartan (DIOVAN) 160 MG tablet Take 80 mg by mouth daily.    Marland Kitchen VITAMIN D PO Take 2,000 Int'l Units by mouth daily.     No current facility-administered medications for this visit.    Family History  Problem Relation Age of Onset  . Prostate cancer Father   . Heart disease Father   . Hyperlipidemia Brother   . Heart disease Mother   . Heart disease Maternal Grandfather   . Heart disease Brother   . Hyperlipidemia Brother   . Heart disease Brother   . Hyperlipidemia Brother   . Hyperlipidemia Brother   . Hyperlipidemia Brother   Her maternal first cousin with breast cancer at 25.  Her daughter has seen a Engineer, mining and was tested and found to have a genetic link to breast cancer (not BRCA). Her daughter doesn't know her Dad's family history.   Review of Systems  All other systems reviewed and are negative.   Exam:   BP 122/68   Pulse 81   Ht '5\' 6"'  (1.676 m)   LMP 03/17/2013 (Exact Date)   SpO2 96%   BMI 32.39 kg/m   Weight change: '@WEIGHTCHANGE' @ Height:   Height: '5\' 6"'  (167.6 cm)  Ht Readings from Last 3 Encounters:  08/24/20 '5\' 6"'  (1.676 m)  09/16/19 5' 6.25" (1.683 m)  08/19/19 5' 6.25" (1.683 m)    General appearance: alert, cooperative and appears stated age Head: Normocephalic, without obvious abnormality, atraumatic Neck: no adenopathy, supple, symmetrical, trachea midline and thyroid normal to inspection and palpation Lungs: clear to auscultation bilaterally Cardiovascular: regular rate and rhythm Breasts: normal appearance, no masses or tenderness Abdomen: soft, non-tender; non distended,  no masses,  no organomegaly Extremities: extremities normal, atraumatic, no cyanosis or edema Skin: Skin color, texture, turgor normal. No rashes or lesions Lymph nodes: Cervical, supraclavicular, and axillary nodes normal. No abnormal inguinal nodes palpated Neurologic: Grossly normal   Pelvic: External genitalia:  no lesions              Urethra:  normal appearing urethra with no masses, tenderness or lesions              Bartholins and Skenes: normal                 Vagina: normal appearing vagina with normal color and discharge, no lesions              Cervix: no lesions and not friable               Bimanual Exam:  Uterus:  normal size, contour, position, consistency, mobility, non-tender              Adnexa: no mass, fullness, tenderness               Rectovaginal: Confirms               Anus:  normal sphincter tone, no lesions  Gae Dry chaperoned for the exam.  A:  Well Woman with normal exam  BMI 27  Daughter with increased risk of breast cancer  Postcoital  spotting, I suspect from vaginal atrophy and tearing  Dyspareunia  Vaginal atrophy  P:   Pap with reflex hpv (patient requests)  Return for vaginal ultrasound. She has a h/o endometrial ablation.  Start vaginal estrogen, no contraindications  Referral to genetics placed  Continue with vaginal lubrication with intercourse  Discussed breast self exam  Discussed calcium and vit D intake  Labs with her primary  Mammogram is scheduled  Colonoscopy is UTD

## 2020-08-24 ENCOUNTER — Ambulatory Visit: Payer: Managed Care, Other (non HMO) | Admitting: Obstetrics and Gynecology

## 2020-08-24 ENCOUNTER — Encounter: Payer: Self-pay | Admitting: Obstetrics and Gynecology

## 2020-08-24 ENCOUNTER — Other Ambulatory Visit: Payer: Self-pay

## 2020-08-24 ENCOUNTER — Ambulatory Visit: Payer: Managed Care, Other (non HMO) | Admitting: Certified Nurse Midwife

## 2020-08-24 VITALS — BP 122/68 | HR 81 | Ht 66.0 in

## 2020-08-24 DIAGNOSIS — Z01419 Encounter for gynecological examination (general) (routine) without abnormal findings: Secondary | ICD-10-CM

## 2020-08-24 DIAGNOSIS — Z124 Encounter for screening for malignant neoplasm of cervix: Secondary | ICD-10-CM

## 2020-08-24 DIAGNOSIS — N941 Unspecified dyspareunia: Secondary | ICD-10-CM

## 2020-08-24 DIAGNOSIS — Z8639 Personal history of other endocrine, nutritional and metabolic disease: Secondary | ICD-10-CM | POA: Diagnosis not present

## 2020-08-24 DIAGNOSIS — N930 Postcoital and contact bleeding: Secondary | ICD-10-CM

## 2020-08-24 DIAGNOSIS — Z803 Family history of malignant neoplasm of breast: Secondary | ICD-10-CM | POA: Diagnosis not present

## 2020-08-24 DIAGNOSIS — N952 Postmenopausal atrophic vaginitis: Secondary | ICD-10-CM

## 2020-08-24 DIAGNOSIS — Z8619 Personal history of other infectious and parasitic diseases: Secondary | ICD-10-CM | POA: Diagnosis not present

## 2020-08-24 MED ORDER — ESTRADIOL 10 MCG VA TABS: ORAL_TABLET | VAGINAL | 4 refills | Status: DC

## 2020-08-24 MED ORDER — VALACYCLOVIR HCL 500 MG PO TABS: ORAL_TABLET | ORAL | 2 refills | Status: DC

## 2020-08-24 NOTE — Patient Instructions (Signed)
EXERCISE AND DIET:  We recommended that you start or continue a regular exercise program for good health. Regular exercise means any activity that makes your heart beat faster and makes you sweat.  We recommend exercising at least 30 minutes per day at least 3 days a week, preferably 4 or 5.  We also recommend a diet low in fat and sugar.  Inactivity, poor dietary choices and obesity can cause diabetes, heart attack, stroke, and kidney damage, among others.    ALCOHOL AND SMOKING:  Women should limit their alcohol intake to no more than 7 drinks/beers/glasses of wine (combined, not each!) per week. Moderation of alcohol intake to this level decreases your risk of breast cancer and liver damage. And of course, no recreational drugs are part of a healthy lifestyle.  And absolutely no smoking or even second hand smoke. Most people know smoking can cause heart and lung diseases, but did you know it also contributes to weakening of your bones? Aging of your skin?  Yellowing of your teeth and nails?  CALCIUM AND VITAMIN D:  Adequate intake of calcium and Vitamin D are recommended.  The recommendations for exact amounts of these supplements seem to change often, but generally speaking 1,200 mg of calcium (between diet and supplement) and 800 units of Vitamin D per day seems prudent. Certain women may benefit from higher intake of Vitamin D.  If you are among these women, your doctor will have told you during your visit.    PAP SMEARS:  Pap smears, to check for cervical cancer or precancers,  have traditionally been done yearly, although recent scientific advances have shown that most women can have pap smears less often.  However, every woman still should have a physical exam from her gynecologist every year. It will include a breast check, inspection of the vulva and vagina to check for abnormal growths or skin changes, a visual exam of the cervix, and then an exam to evaluate the size and shape of the uterus and  ovaries.  And after 57 years of age, a rectal exam is indicated to check for rectal cancers. We will also provide age appropriate advice regarding health maintenance, like when you should have certain vaccines, screening for sexually transmitted diseases, bone density testing, colonoscopy, mammograms, etc.   MAMMOGRAMS:  All women over 40 years old should have a yearly mammogram. Many facilities now offer a "3D" mammogram, which may cost around $50 extra out of pocket. If possible,  we recommend you accept the option to have the 3D mammogram performed.  It both reduces the number of women who will be called back for extra views which then turn out to be normal, and it is better than the routine mammogram at detecting truly abnormal areas.    COLON CANCER SCREENING: Now recommend starting at age 45. At this time colonoscopy is not covered for routine screening until 50. There are take home tests that can be done between 45-49.   COLONOSCOPY:  Colonoscopy to screen for colon cancer is recommended for all women at age 50.  We know, you hate the idea of the prep.  We agree, BUT, having colon cancer and not knowing it is worse!!  Colon cancer so often starts as a polyp that can be seen and removed at colonscopy, which can quite literally save your life!  And if your first colonoscopy is normal and you have no family history of colon cancer, most women don't have to have it again for   10 years.  Once every ten years, you can do something that may end up saving your life, right?  We will be happy to help you get it scheduled when you are ready.  Be sure to check your insurance coverage so you understand how much it will cost.  It may be covered as a preventative service at no cost, but you should check your particular policy.      Breast Self-Awareness Breast self-awareness means being familiar with how your breasts look and feel. It involves checking your breasts regularly and reporting any changes to your  health care provider. Practicing breast self-awareness is important. A change in your breasts can be a sign of a serious medical problem. Being familiar with how your breasts look and feel allows you to find any problems early, when treatment is more likely to be successful. All women should practice breast self-awareness, including women who have had breast implants. How to do a breast self-exam One way to learn what is normal for your breasts and whether your breasts are changing is to do a breast self-exam. To do a breast self-exam: Look for Changes  1. Remove all the clothing above your waist. 2. Stand in front of a mirror in a room with good lighting. 3. Put your hands on your hips. 4. Push your hands firmly downward. 5. Compare your breasts in the mirror. Look for differences between them (asymmetry), such as: ? Differences in shape. ? Differences in size. ? Puckers, dips, and bumps in one breast and not the other. 6. Look at each breast for changes in your skin, such as: ? Redness. ? Scaly areas. 7. Look for changes in your nipples, such as: ? Discharge. ? Bleeding. ? Dimpling. ? Redness. ? A change in position. Feel for Changes Carefully feel your breasts for lumps and changes. It is best to do this while lying on your back on the floor and again while sitting or standing in the shower or tub with soapy water on your skin. Feel each breast in the following way:  Place the arm on the side of the breast you are examining above your head.  Feel your breast with the other hand.  Start in the nipple area and make  inch (2 cm) overlapping circles to feel your breast. Use the pads of your three middle fingers to do this. Apply light pressure, then medium pressure, then firm pressure. The light pressure will allow you to feel the tissue closest to the skin. The medium pressure will allow you to feel the tissue that is a little deeper. The firm pressure will allow you to feel the tissue  close to the ribs.  Continue the overlapping circles, moving downward over the breast until you feel your ribs below your breast.  Move one finger-width toward the center of the body. Continue to use the  inch (2 cm) overlapping circles to feel your breast as you move slowly up toward your collarbone.  Continue the up and down exam using all three pressures until you reach your armpit.  Write Down What You Find  Write down what is normal for each breast and any changes that you find. Keep a written record with breast changes or normal findings for each breast. By writing this information down, you do not need to depend only on memory for size, tenderness, or location. Write down where you are in your menstrual cycle, if you are still menstruating. If you are having trouble noticing differences   in your breasts, do not get discouraged. With time you will become more familiar with the variations in your breasts and more comfortable with the exam. How often should I examine my breasts? Examine your breasts every month. If you are breastfeeding, the best time to examine your breasts is after a feeding or after using a breast pump. If you menstruate, the best time to examine your breasts is 5-7 days after your period is over. During your period, your breasts are lumpier, and it may be more difficult to notice changes. When should I see my health care provider? See your health care provider if you notice:  A change in shape or size of your breasts or nipples.  A change in the skin of your breast or nipples, such as a reddened or scaly area.  Unusual discharge from your nipples.  A lump or thick area that was not there before.  Pain in your breasts.  Anything that concerns you.  Atrophic Vaginitis  Atrophic vaginitis is a condition in which the tissues that line the vagina become dry and thin. This condition is most common in women who have stopped having regular menstrual periods (are in  menopause). This usually starts when a woman is 79-12 years old. That is the time when a woman's estrogen levels begin to drop (decrease). Estrogen is a female hormone. It helps to keep the tissues of the vagina moist. It stimulates the vagina to produce a clear fluid that lubricates the vagina for sexual intercourse. This fluid also protects the vagina from infection. Lack of estrogen can cause the lining of the vagina to get thinner and dryer. The vagina may also shrink in size. It may become less elastic. Atrophic vaginitis tends to get worse over time as a woman's estrogen level drops. What are the causes? This condition is caused by the normal drop in estrogen that happens around the time of menopause. What increases the risk? Certain conditions or situations may lower a woman's estrogen level, leading to a higher risk for atrophic vaginitis. You are more likely to develop this condition if:  You are taking medicines that block estrogen.  You have had your ovaries removed.  You are being treated for cancer with X-ray (radiation) or medicines (chemotherapy).  You have given birth or are breastfeeding.  You are older than age 20.  You smoke. What are the signs or symptoms? Symptoms of this condition include:  Pain, soreness, or bleeding during sexual intercourse (dyspareunia).  Vaginal burning, irritation, or itching.  Pain or bleeding when a speculum is used in a vaginal exam (pelvic exam).  Having burning pain when passing urine.  Vaginal discharge that is brown or yellow. In some cases, there are no symptoms. How is this diagnosed? This condition is diagnosed by taking a medical history and doing a physical exam. This will include a pelvic exam that checks the vaginal tissues. Though rare, you may also have other tests, including:  A urine test.  A test that checks the acid balance in your vagina (acid balance test). How is this treated? Treatment for this condition  depends on how severe your symptoms are. Treatment may include:  Using an over-the-counter vaginal lubricant before sex.  Using a long-acting vaginal moisturizer.  Using low-dose vaginal estrogen for moderate to severe symptoms that do not respond to other treatments. Options include creams, tablets, and inserts (vaginal rings). Before you use a vaginal estrogen, tell your health care provider if you have a history of: ?  Breast cancer. ? Endometrial cancer. ? Blood clots. If you are not sexually active and your symptoms are very mild, you may not need treatment. Follow these instructions at home: Medicines  Take over-the-counter and prescription medicines only as told by your health care provider. Do not use herbal or alternative medicines unless your health care provider says that you can.  Use over-the-counter creams, lubricants, or moisturizers for dryness only as directed by your health care provider. General instructions  If your atrophic vaginitis is caused by menopause, discuss all of your menopause symptoms and treatment options with your health care provider.  Do not douche.  Do not use products that can make your vagina dry. These include: ? Scented feminine sprays. ? Scented tampons. ? Scented soaps.  Vaginal intercourse can help to improve blood flow and elasticity of vaginal tissue. If it hurts to have sex, try using a lubricant or moisturizer just before having intercourse. Contact a health care provider if:  Your discharge looks different than normal.  Your vagina has an unusual smell.  You have new symptoms.  Your symptoms do not improve with treatment.  Your symptoms get worse. Summary  Atrophic vaginitis is a condition in which the tissues that line the vagina become dry and thin. It is most common in women who have stopped having regular menstrual periods (are in menopause).  Treatment options include using vaginal lubricants and low-dose vaginal  estrogen.  Contact a health care provider if your vagina has an unusual smell, or if your symptoms get worse or do not improve after treatment. This information is not intended to replace advice given to you by your health care provider. Make sure you discuss any questions you have with your health care provider. Document Revised: 10/04/2017 Document Reviewed: 07/18/2017 Elsevier Patient Education  2020 [employer].

## 2020-08-25 ENCOUNTER — Telehealth: Payer: Self-pay | Admitting: Genetic Counselor

## 2020-08-25 NOTE — Telephone Encounter (Signed)
Received a genetic counseling referral from Dr. Talbert Nan for fhx of breast cancer. Ms. Thomaston has been cld and scheduled to see Raquel Sarna on 11/9 at 11am. Pt aware to arrive 15 minutes early.

## 2020-08-26 ENCOUNTER — Other Ambulatory Visit (HOSPITAL_COMMUNITY)
Admission: RE | Admit: 2020-08-26 | Discharge: 2020-08-26 | Disposition: A | Discharge status: Home / Self Care | Payer: Managed Care, Other (non HMO) | Source: Ambulatory Visit | Attending: Obstetrics and Gynecology | Admitting: Obstetrics and Gynecology

## 2020-08-26 DIAGNOSIS — Z124 Encounter for screening for malignant neoplasm of cervix: Secondary | ICD-10-CM | POA: Diagnosis present

## 2020-08-26 NOTE — Addendum Note (Signed)
Addended by: Dorothy Spark on: 08/26/2020 10:32 AM   Modules accepted: Orders

## 2020-08-29 LAB — CYTOLOGY - PAP: Diagnosis: NEGATIVE

## 2020-09-01 ENCOUNTER — Other Ambulatory Visit: Payer: Self-pay

## 2020-09-01 ENCOUNTER — Other Ambulatory Visit: Payer: Self-pay | Admitting: Obstetrics and Gynecology

## 2020-09-01 ENCOUNTER — Ambulatory Visit (INDEPENDENT_AMBULATORY_CARE_PROVIDER_SITE_OTHER): Payer: Managed Care, Other (non HMO)

## 2020-09-01 ENCOUNTER — Ambulatory Visit: Payer: Managed Care, Other (non HMO) | Admitting: Obstetrics and Gynecology

## 2020-09-01 ENCOUNTER — Encounter: Payer: Self-pay | Admitting: Obstetrics and Gynecology

## 2020-09-01 VITALS — BP 110/66 | HR 76 | Ht 66.0 in

## 2020-09-01 DIAGNOSIS — N930 Postcoital and contact bleeding: Secondary | ICD-10-CM | POA: Diagnosis not present

## 2020-09-01 NOTE — Progress Notes (Signed)
GYNECOLOGY  VISIT   HPI: 59 y.o.   Married White or Caucasian Not Hispanic or Latino  female   [phone] with Patient's last menstrual period was 03/17/2013 (exact date).   here for ultrasound consult for post coital spotting.   GYNECOLOGIC HISTORY: Patient's last menstrual period was 03/17/2013 (exact date). Contraception:PMP Menopausal hormone therapy: estradiol 42mcg tab        OB History    Gravida  6   Para  2   Term      Preterm      AB  4   Living  2     SAB      TAB      Ectopic      Multiple      Live Births                 Patient Active Problem List   Diagnosis Date Noted  . Lymphocytosis 09/24/2018  . Shoulder pain 01/29/2018  . Pain in left knee 01/27/2018  . Family history of early CAD 07/04/2017  . Encounter for consultation 01/05/2015  . Consultation without specific complaint 11/24/2014  . Reason for consultation 11/09/2014  . Palpitations 11/12/2013  . Obesity, unspecified 11/12/2013  . Benign essential HTN 11/12/2013  . Mitral regurgitation 11/12/2013  . Facial asymmetry 11/12/2013  . METATARSALGIA 08/01/2009  . ABNORMALITY OF GAIT 08/01/2009    Past Medical History:  Diagnosis Date  . Allergic rhinitis   . Anxiety   . Asthma   . Depression    2005  . Dyslipidemia   . Edema in pregnancy   . Exercise induced bronchospasm   . Family history of early CAD 07/04/2017  . HSV-2 (herpes simplex virus 2) infection   . Hypertension   . Hypophysitis ([place])    H/O Autoimmune  . Mitral regurgitation   . Palpitations   . Panic attack   . Perennial allergic rhinitis   . PIH (pregnancy induced hypertension)    P partum// severe   . Tonsillar calculus 05/2012   Dr Orpah Greek bates  . Torn meniscus     Past Surgical History:  Procedure Laterality Date  . ABLATION  2007   HTA  . CESAREAN SECTION     x 2// Due to HSV2  . FOOT SURGERY    . KNEE SURGERY Left 2019  . plastic eye surgery      Current Outpatient Medications  Medication  Sig Dispense Refill  . albuterol (PROVENTIL HFA;VENTOLIN HFA) 108 (90 BASE) MCG/ACT inhaler Inhale 2 puffs into the lungs every 4 (four) hours as needed for wheezing or shortness of breath.     Marland Kitchen aspirin 81 MG tablet Take 81 mg by mouth daily.    . Clindamycin-Benzoyl Per, Refr, gel Reported on 11/17/2015  6  . clonazePAM (KLONOPIN) 1 MG tablet Take 1 mg by mouth daily.    . Estradiol 10 MCG TABS vaginal tablet Place one tablet vaginally qhs x 1 week, then switch to 2 x a week. 24 tablet 4  . fluticasone (FLONASE) 50 MCG/ACT nasal spray 1 2 SPRAY IN EACH NOSTRIL TWICE A DAY NASALLY 30 DAYS    . ipratropium (ATROVENT) 0.06 % nasal spray Place 2 sprays into both nostrils 2 (two) times daily.    Marland Kitchen levocetirizine (XYZAL) 5 MG tablet Take 5 mg by mouth at bedtime.    Marland Kitchen QVAR 80 MCG/ACT inhaler Inhale 2 puffs into the lungs as needed.   5  . rosuvastatin (CRESTOR) 10 MG  tablet Take 5 mg by mouth daily.    . sertraline (ZOLOFT) 50 MG tablet take 1/2 daily  1  . tretinoin (RETIN-A) 0.05 % cream   9  . triamcinolone cream (KENALOG) 0.1 % Apply 1 application topically as needed. Reported on 11/17/2015    . valACYclovir (VALTREX) 500 MG tablet Take one tablet po bid x 3 days prn 30 tablet 2  . valsartan (DIOVAN) 160 MG tablet Take 80 mg by mouth daily.    Marland Kitchen VITAMIN D PO Take 2,000 Int'l Units by mouth daily.     No current facility-administered medications for this visit.     ALLERGIES: Erythromycin  Family History  Problem Relation Age of Onset  . Prostate cancer Father   . Heart disease Father   . Hyperlipidemia Brother   . Heart disease Mother   . Heart disease Maternal Grandfather   . Heart disease Brother   . Hyperlipidemia Brother   . Heart disease Brother   . Hyperlipidemia Brother   . Hyperlipidemia Brother   . Hyperlipidemia Brother     Social History   Socioeconomic History  . Marital status: Married    Spouse name: Not on file  . Number of children: Not on file  . Years of  education: Not on file  . Highest education level: Not on file  Occupational History  . Not on file  Tobacco Use  . Smoking status: Never Smoker  . Smokeless tobacco: Never Used  Substance and Sexual Activity  . Alcohol use: Yes    Alcohol/week: 3.0 standard drinks    Types: 3 Standard drinks or equivalent per week  . Drug use: No  . Sexual activity: Yes    Partners: Male    Birth control/protection: Other-see comments, Post-menopausal    Comment: vasectomy  Other Topics Concern  . Not on file  Social History Narrative  . Not on file   Social Determinants of Health   Financial Resource Strain:   . Difficulty of Paying Living Expenses: Not on file  Food Insecurity:   . Worried About Charity fundraiser in the Last Year: Not on file  . Ran Out of Food in the Last Year: Not on file  Transportation Needs:   . Lack of Transportation (Medical): Not on file  . Lack of Transportation (Non-Medical): Not on file  Physical Activity:   . Days of Exercise per Week: Not on file  . Minutes of Exercise per Session: Not on file  Stress:   . Feeling of Stress : Not on file  Social Connections:   . Frequency of Communication with Friends and Family: Not on file  . Frequency of Social Gatherings with Friends and Family: Not on file  . Attends Religious Services: Not on file  . Active Member of Clubs or Organizations: Not on file  . Attends Archivist Meetings: Not on file  . Marital Status: Not on file  Intimate Partner Violence:   . Fear of Current or Ex-Partner: Not on file  . Emotionally Abused: Not on file  . Physically Abused: Not on file  . Sexually Abused: Not on file    Review of Systems  All other systems reviewed and are negative.   PHYSICAL EXAMINATION:    BP 110/66   Pulse 76   Ht 5\' 6"  (1.676 m)   LMP 03/17/2013 (Exact Date)   SpO2 98%   BMI 32.39 kg/m     General appearance: alert, cooperative and appears stated  age  Ultrasound images reviewed with  the patient  ASSESSMENT Postcoital spotting, normal ultrasound. C/w tearing from vaginal atrophy.    PLAN She is starting vaginal estrogen Routine f/u

## 2020-09-07 ENCOUNTER — Encounter: Payer: Self-pay | Admitting: Obstetrics and Gynecology

## 2020-09-08 ENCOUNTER — Other Ambulatory Visit: Payer: Self-pay | Admitting: Obstetrics and Gynecology

## 2020-09-13 ENCOUNTER — Inpatient Hospital Stay: Payer: Managed Care, Other (non HMO)

## 2020-09-13 ENCOUNTER — Other Ambulatory Visit: Payer: Self-pay

## 2020-09-13 ENCOUNTER — Inpatient Hospital Stay: Payer: Managed Care, Other (non HMO) | Attending: Genetic Counselor | Admitting: Genetic Counselor

## 2020-09-13 ENCOUNTER — Encounter: Payer: Self-pay | Admitting: Genetic Counselor

## 2020-09-13 DIAGNOSIS — Z8042 Family history of malignant neoplasm of prostate: Secondary | ICD-10-CM

## 2020-09-13 DIAGNOSIS — Z801 Family history of malignant neoplasm of trachea, bronchus and lung: Secondary | ICD-10-CM | POA: Diagnosis not present

## 2020-09-13 DIAGNOSIS — Z808 Family history of malignant neoplasm of other organs or systems: Secondary | ICD-10-CM

## 2020-09-13 DIAGNOSIS — Z803 Family history of malignant neoplasm of breast: Secondary | ICD-10-CM

## 2020-09-13 NOTE — Progress Notes (Signed)
REFERRING PROVIDER: Salvadore Dom, [address] [place],  [place] [address]  PRIMARY PROVIDER:  Josetta Huddle, MD  PRIMARY REASON FOR VISIT:  1. Family history of prostate cancer   2. Family history of thyroid cancer   3. Family history of lung cancer   4. Family history of breast cancer      HISTORY OF PRESENT ILLNESS:   Vickie Perry, a 59 y.o. female, was seen for a [place] cancer genetics consultation at the request of Dr. Talbert Nan due to a personal and family history of cancer and genetic testing.  Vickie Perry presents to clinic today to discuss the possibility of a hereditary predisposition to cancer, genetic testing, and to further clarify her future cancer risks, as well as potential cancer risks for family members.   Vickie Perry does not have a personal history of cancer.    RISK FACTORS:  Menarche was at age 83.  First live birth at age 40.  OCP use for approximately 1-2 years.  Ovaries intact: yes.  Hysterectomy: no.  Menopausal status: postmenopausal.  HRT use: 0 years. Colonoscopy: yes; 2015 no polyps. Mammogram within the last year: yes. Number of breast biopsies: 0. Up to date with pelvic exams: yes. Any excessive radiation exposure in the past: no  Past Medical History:  Diagnosis Date  . Allergic rhinitis   . Anxiety   . Asthma   . Depression    2005  . Dyslipidemia   . Edema in pregnancy   . Exercise induced bronchospasm   . Family history of breast cancer    maternal second cousin  . Family history of early CAD 07/04/2017  . Family history of lung cancer   . Family history of prostate cancer   . Family history of thyroid cancer   . HSV-2 (herpes simplex virus 2) infection   . Hypertension   . Hypophysitis ([place])    H/O Autoimmune  . Mitral regurgitation   . Palpitations   . Panic attack   . Perennial allergic rhinitis   . PIH (pregnancy induced hypertension)    P partum// severe   . Tonsillar calculus 05/2012   Dr  Orpah Greek bates  . Torn meniscus     Past Surgical History:  Procedure Laterality Date  . ABLATION  2007   HTA  . CESAREAN SECTION     x 2// Due to HSV2  . FOOT SURGERY    . KNEE SURGERY Left 2019  . plastic eye surgery      Social History   Socioeconomic History  . Marital status: Married    Spouse name: Not on file  . Number of children: Not on file  . Years of education: Not on file  . Highest education level: Not on file  Occupational History  . Not on file  Tobacco Use  . Smoking status: Never Smoker  . Smokeless tobacco: Never Used  Substance and Sexual Activity  . Alcohol use: Yes    Alcohol/week: 3.0 standard drinks    Types: 3 Standard drinks or equivalent per week  . Drug use: No  . Sexual activity: Yes    Partners: Male    Birth control/protection: Other-see comments, Post-menopausal    Comment: vasectomy  Other Topics Concern  . Not on file  Social History Narrative  . Not on file   Social Determinants of Health   Financial Resource Strain:   . Difficulty of Paying Living Expenses: Not on file  Food Insecurity:   .  Worried About Charity fundraiser in the Last Year: Not on file  . Ran Out of Food in the Last Year: Not on file  Transportation Needs:   . Lack of Transportation (Medical): Not on file  . Lack of Transportation (Non-Medical): Not on file  Physical Activity:   . Days of Exercise per Week: Not on file  . Minutes of Exercise per Session: Not on file  Stress:   . Feeling of Stress : Not on file  Social Connections:   . Frequency of Communication with Friends and Family: Not on file  . Frequency of Social Gatherings with Friends and Family: Not on file  . Attends Religious Services: Not on file  . Active Member of Clubs or Organizations: Not on file  . Attends Archivist Meetings: Not on file  . Marital Status: Not on file     FAMILY HISTORY:  We obtained a detailed, 4-generation family history.  Significant diagnoses are  listed below: Family History  Problem Relation Age of Onset  . Prostate cancer Father 42       metastatic  . Heart disease Father   . Hyperlipidemia Brother   . Thyroid cancer Brother 42  . Heart disease Mother   . Heart disease Maternal Grandfather   . Cancer Paternal Grandfather        unknown type  . Heart disease Brother   . Hyperlipidemia Brother   . Heart disease Brother   . Hyperlipidemia Brother   . Hyperlipidemia Brother   . Hyperlipidemia Brother   . Alzheimer's disease Maternal Grandmother   . Thyroid cancer Paternal Grandmother        dx 81s  . Lung cancer Paternal Aunt        smoker  . Cancer Other        unknown type, dx late 61s, maternal great-aunt  . Cancer Maternal Uncle        unknown type, dx 32s  . Cancer Paternal Uncle        unknown type, dx >50  . Cancer Paternal Uncle        unknown type, dx >50  . Breast cancer Other 59       maternal second cousin   Vickie Perry has one daughter (age 54) and one son (age 2). Her daughter had previous genetic testing through the [facility] panel, which was negative. Vickie Perry has five brothers and one sister. One brother had cancer diagnosed at the age of 58, which she believes was thyroid cancer.   Vickie Perry mother died at the age of 60 and did not have cancer. Vickie Perry had two maternal uncles. One uncle had cancer (unknown type) diagnosed in his 74s. Her maternal grandmother died at the age of 45 and her maternal grandfather died at the age of 55 from a heart attack. Her maternal grandmother's sister had cancer (unknown type) diagnosed in her late 6s. This great-aunt had a granddaughter (Vickie Perry second cousin) who was diagnosed with breast cancer at the age of 88.  Vickie Perry fatherdied at the age of 11 from metastatic prostate cancer that was first diagnosed when he was 2. She had four paternal uncles and two paternal aunts. Two uncles had cancer (unknown types) diagnosed older than 2.  One aunt had lung cancer and was a smoker. Her paternal grandmother died at age 23 and had thyroid cancer in her 62s. Her paternal grandfather died at age 82 and had an unknown type  of cancer.  Vickie Perry is aware of previous family history of genetic testing for hereditary cancer risks. Patient's maternal ancestors are of [place] descent, and paternal ancestors are of [place] descent. There is no reported Ashkenazi Jewish ancestry. There is no known consanguinity.  GENETIC COUNSELING ASSESSMENT: Vickie Perry is a 59 y.o. female with a family history of metastatic prostate cancer which is somewhat suggestive of a hereditary cancer syndrome and predisposition to cancer. We, therefore, discussed and recommended the following at today's visit.   DISCUSSION: We discussed that approximately 5-10% of cancer is hereditary. Most cases of hereditary prostate cancer are associated with the BRCA1/2 genes, although there are other genes that can be associated with hereditary prostate cancer syndromes. These include ATM, CHEK2, HOXB13, etc. We discussed that testing is beneficial for several reasons, including knowing about other cancer risks, identifying potential screening and risk-reduction options that may be appropriate, and to understand if other family members could be at risk for cancer and allow them to undergo genetic testing.  We reviewed the characteristics, features and inheritance patterns of hereditary cancer syndromes. We also discussed genetic testing, including the appropriate family members to test, the process of testing, insurance coverage and turn-around-time for results. We discussed the implications of a negative vs a positive result. We recommended Vickie Perry pursue genetic testing for a hereditary cancer gene panel that includes known prostate cancer genes.   Based on Vickie Perry family history of cancer, she meets medical criteria for genetic testing. Despite that she meets  criteria, there may still be an out of pocket cost.   We discussed that some people do not want to undergo genetic testing due to fear of genetic discrimination.  A federal law called the Genetic Information Non-Discrimination Act (GINA) of 2008 helps protect individuals against genetic discrimination based on their genetic test results.  It impacts both health insurance and employment.  With health insurance, it protects against increased premiums, being kicked off insurance or being forced to take a test in order to be insured.  For employment it protects against hiring, firing and promoting decisions based on genetic test results.  Health status due to a cancer diagnosis is not protected under GINA.  Additionally, life, disability, and long-term care insurance is not protected under GINA. Vickie Perry is considering changing/adding life insurance policy and would like to delay genetic testing until after these policies are in place, likely in one-two years.  We discussed the option to use a statistical model, Tyrer-Cuzick, to estimate her lifetime risk of developing breast cancer and to determine whether she may benefit from additional breast cancer screening. Vickie Perry declined use of this risk model today.   PLAN:  Vickie Perry did not wish to pursue genetic testing at today's visit. We understand this decision and remain available to coordinate genetic testing at any time in the future. We, therefore, recommend Vickie Perry continue to follow the cancer screening guidelines given by her primary healthcare provider.  Ms. Feltz questions were answered to her satisfaction today. Our contact information was provided should additional questions or concerns arise. Thank you for the referral and allowing [place] to share in the care of your patient.   Clint Guy, [place], [facility] Licensed, Certified Dispensing [email or web address] Phone: [phone]  The patient was seen for a total of  60 minutes in face-to-face genetic counseling.  This patient was discussed with Drs. Magrinat, Lindi Adie and/or Burr Medico who agrees with the above.    _______________________________________________________________________ For Office Staff:  Number of people involved in session: 2 Was an Intern/ student involved with case: yes - observation only

## 2020-09-20 ENCOUNTER — Other Ambulatory Visit: Payer: Self-pay

## 2020-09-20 ENCOUNTER — Ambulatory Visit: Payer: Managed Care, Other (non HMO) | Admitting: Dermatology

## 2020-09-20 ENCOUNTER — Encounter: Payer: Self-pay | Admitting: Dermatology

## 2020-09-20 DIAGNOSIS — L570 Actinic keratosis: Secondary | ICD-10-CM | POA: Diagnosis not present

## 2020-09-20 DIAGNOSIS — D229 Melanocytic nevi, unspecified: Secondary | ICD-10-CM

## 2020-09-20 DIAGNOSIS — L738 Other specified follicular disorders: Secondary | ICD-10-CM | POA: Diagnosis not present

## 2020-09-20 DIAGNOSIS — L720 Epidermal cyst: Secondary | ICD-10-CM

## 2020-09-20 DIAGNOSIS — D225 Melanocytic nevi of trunk: Secondary | ICD-10-CM

## 2020-09-21 ENCOUNTER — Encounter: Payer: Self-pay | Admitting: Cardiology

## 2020-09-21 ENCOUNTER — Other Ambulatory Visit: Payer: Self-pay | Admitting: Obstetrics and Gynecology

## 2020-09-21 ENCOUNTER — Ambulatory Visit: Payer: Managed Care, Other (non HMO) | Admitting: Cardiology

## 2020-09-21 VITALS — BP 134/80 | HR 65 | Ht 66.0 in

## 2020-09-21 DIAGNOSIS — I340 Nonrheumatic mitral (valve) insufficiency: Secondary | ICD-10-CM

## 2020-09-21 DIAGNOSIS — R002 Palpitations: Secondary | ICD-10-CM

## 2020-09-21 DIAGNOSIS — I10 Essential (primary) hypertension: Secondary | ICD-10-CM | POA: Diagnosis not present

## 2020-09-21 NOTE — Addendum Note (Signed)
Addended by: Antonieta Iba on: 09/21/2020 02:55 PM   Modules accepted: Orders

## 2020-09-21 NOTE — Progress Notes (Signed)
Cardiology Office Note:    Date:  09/21/2020   ID:  Vickie Perry, DOB May 06, 1961, MRN [id number]  PCP:  Vickie Huddle, MD  Cardiologist:  No primary care provider on file.    Referring MD: Vickie Huddle, MD   Chief Complaint  Patient presents with  . Mitral Regurgitation  . Aortic Insuffiency  . Hypertension    History of Present Illness:    Vickie Perry is a 59 y.o. female with a hx of mild MR/AR/PR, HTN, family history of CAD with coronary calcium score of 0 and history of palpitations with no significant arrhythmias on heart monitor.  She is here today for followup and is doing well.  She denies any chest pain or pressure, SOB, DOE, PND, orthopnea, LE edema, dizziness, or syncope. The palpitations have really settled down and has only had about 5 episodes in the past year. She is compliant with her meds and is tolerating meds with no SE.    Past Medical History:  Diagnosis Date  . Allergic rhinitis   . Anxiety   . Asthma   . Depression    2005  . Dyslipidemia   . Edema in pregnancy   . Exercise induced bronchospasm   . Family history of breast cancer    maternal second cousin  . Family history of early CAD 07/04/2017  . Family history of lung cancer   . Family history of prostate cancer   . Family history of thyroid cancer   . HSV-2 (herpes simplex virus 2) infection   . Hypertension   . Hypophysitis ([place])    H/O Autoimmune  . Mitral regurgitation   . Palpitations   . Panic attack   . Perennial allergic rhinitis   . PIH (pregnancy induced hypertension)    P partum// severe   . Tonsillar calculus 05/2012   Dr Orpah Greek bates  . Torn meniscus     Past Surgical History:  Procedure Laterality Date  . ABLATION  2007   HTA  . CESAREAN SECTION     x 2// Due to HSV2  . FOOT SURGERY    . KNEE SURGERY Left 2019  . plastic eye surgery      Current Medications: Current Meds  Medication Sig  . albuterol (PROVENTIL HFA;VENTOLIN HFA) 108 (90  BASE) MCG/ACT inhaler Inhale 2 puffs into the lungs every 4 (four) hours as needed for wheezing or shortness of breath.   Marland Kitchen aspirin 81 MG tablet Take 81 mg by mouth daily.  . Clindamycin-Benzoyl Per, Refr, gel Reported on 11/17/2015  . clonazePAM (KLONOPIN) 1 MG tablet Take 1 mg by mouth daily.  . Estradiol 10 MCG TABS vaginal tablet Place one tablet vaginally qhs x 1 week, then switch to 2 x a week.  . fluticasone (FLONASE) 50 MCG/ACT nasal spray 1 2 SPRAY IN EACH NOSTRIL TWICE A DAY NASALLY 30 DAYS  . ipratropium (ATROVENT) 0.06 % nasal spray Place 2 sprays into both nostrils 2 (two) times daily.  Marland Kitchen levocetirizine (XYZAL) 5 MG tablet Take 5 mg by mouth at bedtime.  Marland Kitchen QVAR 80 MCG/ACT inhaler Inhale 2 puffs into the lungs as needed.   . rosuvastatin (CRESTOR) 10 MG tablet Take 5 mg by mouth daily.  . sertraline (ZOLOFT) 50 MG tablet take 1/2 daily  . tretinoin (RETIN-A) 0.05 % cream   . triamcinolone cream (KENALOG) 0.1 % Apply 1 application topically as needed. Reported on 11/17/2015  . valACYclovir (VALTREX) 500 MG tablet Take one tablet po bid  x 3 days prn  . valsartan (DIOVAN) 160 MG tablet Take 80 mg by mouth daily.  Marland Kitchen VITAMIN D PO Take 2,000 Int'l Units by mouth daily.     Allergies:   Erythromycin   Social History   Socioeconomic History  . Marital status: Married    Spouse name: Not on file  . Number of children: Not on file  . Years of education: Not on file  . Highest education level: Not on file  Occupational History  . Not on file  Tobacco Use  . Smoking status: Never Smoker  . Smokeless tobacco: Never Used  Substance and Sexual Activity  . Alcohol use: Yes    Alcohol/week: 3.0 standard drinks    Types: 3 Standard drinks or equivalent per week  . Drug use: No  . Sexual activity: Yes    Partners: Male    Birth control/protection: Other-see comments, Post-menopausal    Comment: vasectomy  Other Topics Concern  . Not on file  Social History Narrative  . Not on file    Social Determinants of Health   Financial Resource Strain:   . Difficulty of Paying Living Expenses: Not on file  Food Insecurity:   . Worried About Charity fundraiser in the Last Year: Not on file  . Ran Out of Food in the Last Year: Not on file  Transportation Needs:   . Lack of Transportation (Medical): Not on file  . Lack of Transportation (Non-Medical): Not on file  Physical Activity:   . Days of Exercise per Week: Not on file  . Minutes of Exercise per Session: Not on file  Stress:   . Feeling of Stress : Not on file  Social Connections:   . Frequency of Communication with Friends and Family: Not on file  . Frequency of Social Gatherings with Friends and Family: Not on file  . Attends Religious Services: Not on file  . Active Member of Clubs or Organizations: Not on file  . Attends Archivist Meetings: Not on file  . Marital Status: Not on file     Family History: The patient's family history includes Alzheimer's disease in her maternal grandmother; Breast cancer (age of onset: 75) in an other family member; Cancer in her maternal uncle, paternal grandfather, paternal uncle, paternal uncle, and another family member; Heart disease in her brother, brother, father, maternal grandfather, and mother; Hyperlipidemia in her brother, brother, brother, brother, and brother; Lung cancer in her paternal aunt; Prostate cancer (age of onset: 41) in her father; Thyroid cancer in her paternal grandmother; Thyroid cancer (age of onset: 13) in her brother.  ROS:   Please see the history of present illness.    ROS  All other systems reviewed and negative.   EKGs/Labs/Other Studies Reviewed:    The following studies were reviewed today: none  EKG:  EKG is  ordered today.  The ekg ordered today demonstrates NSR with no ST changes  Recent Labs: No results found for requested labs within last 8760 hours.   Recent Lipid Panel    Component Value Date/Time   CHOL 172  03/27/2016 1535   TRIG 164 (H) 03/27/2016 1535   HDL 58 03/27/2016 1535   CHOLHDL 3.0 03/27/2016 1535   VLDL 33 (H) 03/27/2016 1535   LDLCALC 81 03/27/2016 1535    Physical Exam:    VS:  BP 134/80   Pulse 65   Ht 5\' 6"  (1.676 m)   LMP 03/17/2013 (Exact Date)  SpO2 96%   BMI 32.39 kg/m     Wt Readings from Last 3 Encounters:  09/24/18 200 lb 11.2 oz (91 kg)  09/10/18 197 lb (89.4 kg)  08/18/18 197 lb (89.4 kg)     GEN: Well nourished, well developed in no acute distress HEENT: Normal NECK: No JVD; No carotid bruits LYMPHATICS: No lymphadenopathy CARDIAC:RRR, no murmurs, rubs, gallops RESPIRATORY:  Clear to auscultation without rales, wheezing or rhonchi  ABDOMEN: Soft, non-tender, non-distended MUSCULOSKELETAL:  No edema; No deformity  SKIN: Warm and dry NEUROLOGIC:  Alert and oriented x 3 PSYCHIATRIC:  Normal affect    ASSESSMENT:    1. Nonrheumatic mitral valve regurgitation   2. Benign essential HTN   3. Palpitations    PLAN:    In order of problems listed above:  1. Mitral regurgitation -trivial on echo 2018  2.  HTN -BP controlled in office -she will continue on Valsartan 160mg  daily -check BMET  3.  Palpitations -she denies any palpitations -she has not used an BB for breakthrough recently   Medication Adjustments/Labs and Tests Ordered: Current medicines are reviewed at length with the patient today.  Concerns regarding medicines are outlined above.  Orders Placed This Encounter  Procedures  . EKG 12-Lead   No orders of the defined types were placed in this encounter.   Signed, Fransico Him, MD  09/21/2020 2:45 PM    [facility]

## 2020-09-21 NOTE — Patient Instructions (Addendum)
Medication Instructions:  Your physician recommends that you continue on your current medications as directed. Please refer to the Current Medication list given to you today.  *If you need a refill on your cardiac medications before your next appointment, please call your pharmacy*   Lab Work: TODAY: BMET If you have labs (blood work) drawn today and your tests are completely normal, you will receive your results only by: MyChart Message (if you have MyChart) OR A paper copy in the mail If you have any lab test that is abnormal or we need to change your treatment, we will call you to review the results.   Follow-Up: At [facility], you and your health needs are our priority.  As part of our continuing mission to provide you with exceptional heart care, we have created designated Provider Care Teams.  These Care Teams include your primary Cardiologist (physician) and Advanced Practice Providers (APPs -  Physician Assistants and Nurse Practitioners) who all work together to provide you with the care you need, when you need it.   Your next appointment:   1 year(s)  The format for your next appointment:   In Person  Provider:   You may see Traci Turner, MD or one of the following Advanced Practice Providers on your designated Care Team:   Dayna Dunn, PA-C Michele Lenze, PA-C   

## 2020-09-23 ENCOUNTER — Encounter: Payer: Self-pay | Admitting: Dermatology

## 2020-10-04 DIAGNOSIS — M25512 Pain in left shoulder: Secondary | ICD-10-CM | POA: Insufficient documentation

## 2020-10-10 NOTE — Progress Notes (Signed)
   Follow-Up Visit   Subjective  Vickie Perry is a 59 y.o. female who presents for the following: Skin Problem ( bottomlip- new white spot, left eyelid- new, bumbs on fingers- x months).  New spots Location: Face Duration:  Quality:  Associated Signs/Symptoms: Modifying Factors:  Severity:  Timing: Context: Also check moles on back  Objective  Well appearing patient in no apparent distress; mood and affect are within normal limits.  All sun exposed areas plus back examined.   Assessment & Plan    Milia Left Upper Eyelid  Benign-no further treatment necessary  AK (actinic keratosis) Mid Lower Vermilion Lip  Defer freezing of a smaller lesion until we see how the larger one does.  If no response to freezing would consider shave biopsy.  Destruction of lesion - Mid Lower Vermilion Lip Complexity: simple   Destruction method: cryotherapy   Informed consent: discussed and consent obtained   Timeout:  patient name, date of birth, surgical site, and procedure verified Lesion destroyed using liquid nitrogen: Yes   Cryotherapy cycles:  5 Outcome: patient tolerated procedure well with no complications    Sebaceous hyperplasia Right Malar Cheek  Biopsy if grows or bleeding.  Nevus Mid Back  Self examine twice annually.     I, Lavonna Monarch, MD, have reviewed all documentation for this visit.  The documentation on 10/10/20 for the exam, diagnosis, procedures, and orders are all accurate and complete.

## 2020-11-10 IMAGING — CT CT ABD-PELV W/ CM
2 of 5 series · 13 of 36 positions shown, 16 images · IV contrast (OMNIPAQUE)
Comparison: Cardiac CTA 07/05/2017.

CLINICAL DATA: Abnormal weight loss, lymphocytosis.

EXAM:
CT CHEST, ABDOMEN, AND PELVIS WITH CONTRAST
TECHNIQUE: Multidetector CT imaging of the chest, abdomen and pelvis was
performed following the standard protocol during bolus
administration of intravenous contrast.
CONTRAST:  100mL OMNIPAQUE IOHEXOL 300 MG/ML  SOLN

[Series 2: cap with · axial · 0.75mm/px · z∈[-605,-65]mm · 10 of 134 slices shown, 13 images]
[im 13/134  mediastinal]
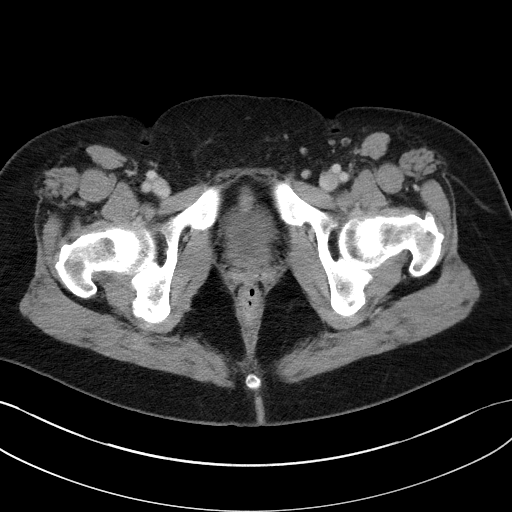
[im 13/134  lung]
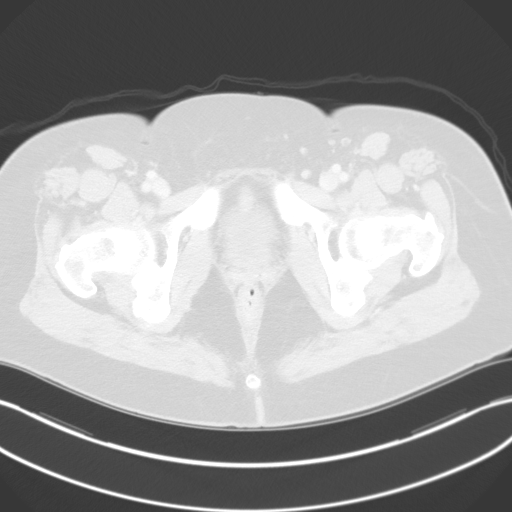
[im 25/134  lung]
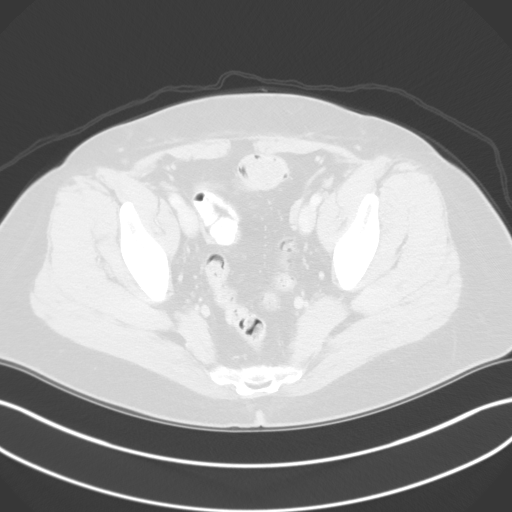
[im 37/134  lung]
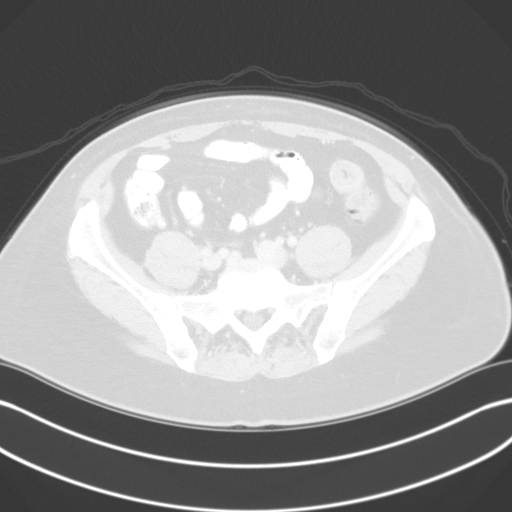
[im 49/134  lung]
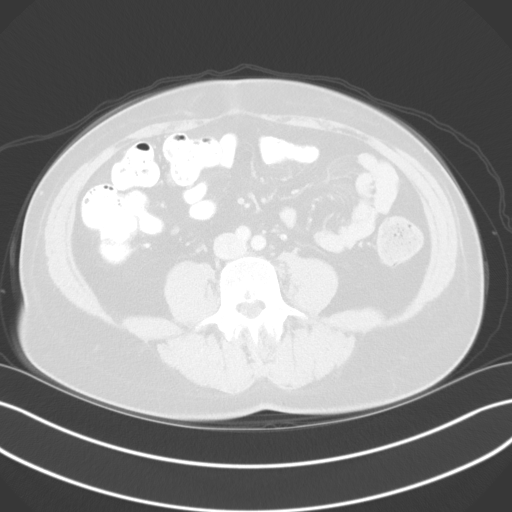
[im 61/134  mediastinal]
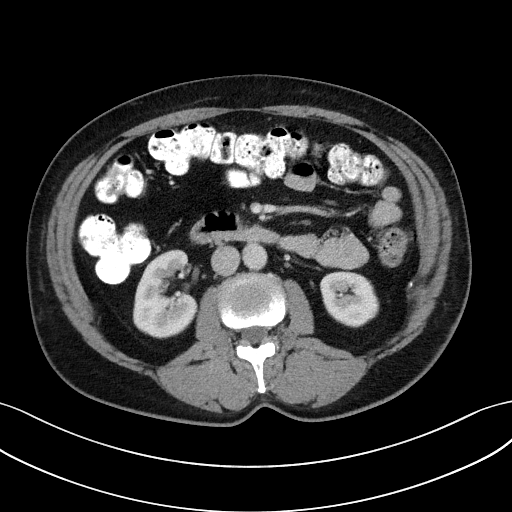
[im 61/134  lung]
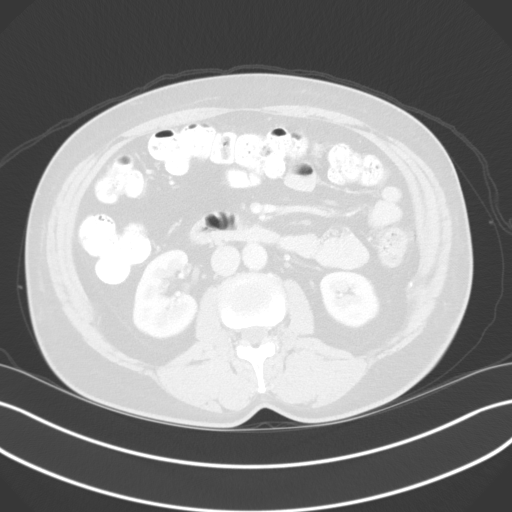
[im 73/134  lung]
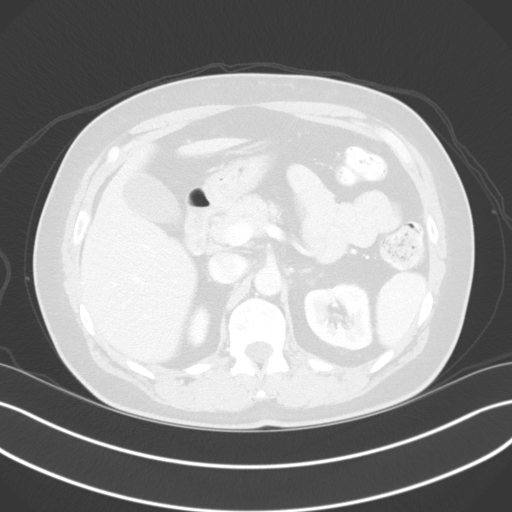
[im 85/134  lung]
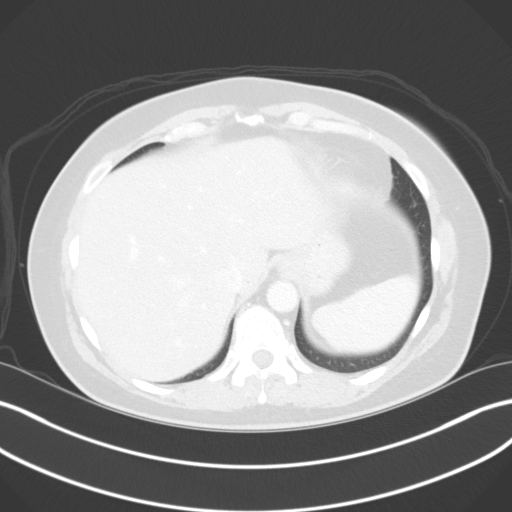
[im 97/134  lung]
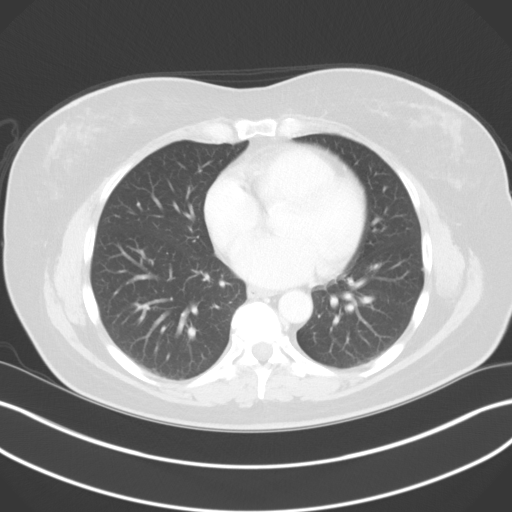
[im 109/134  mediastinal]
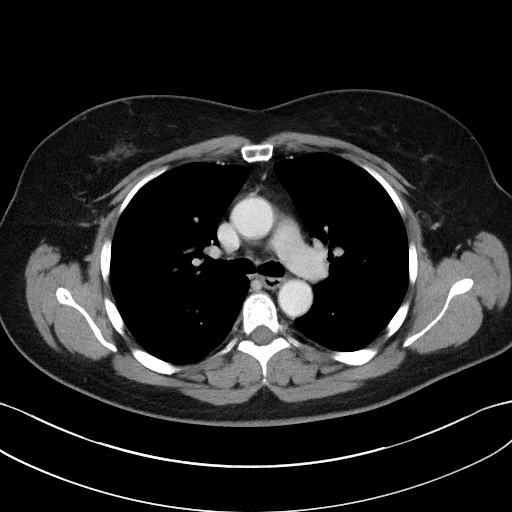
[im 109/134  lung]
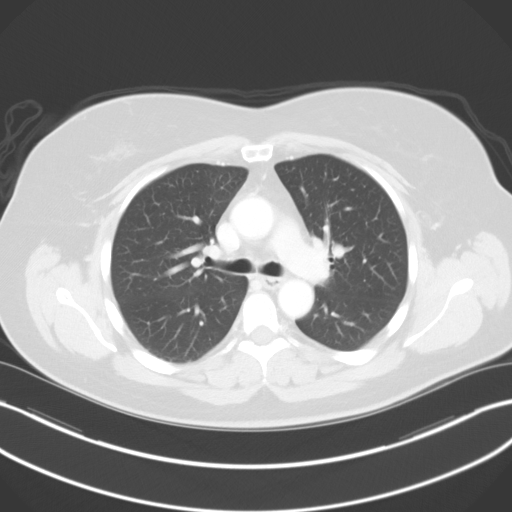
[im 121/134  lung]
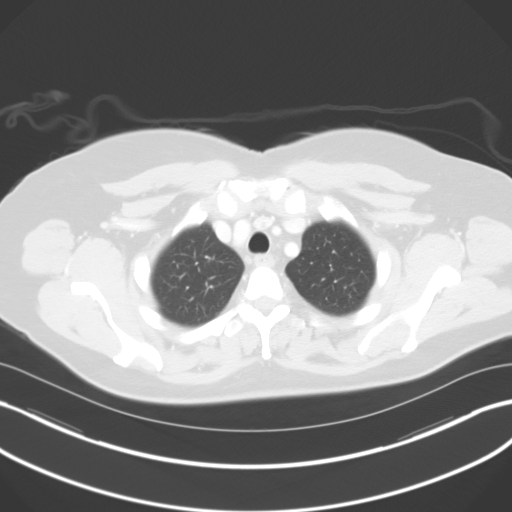

[Series 5: coronals · coronal · 0.74mm/px · 3 of 155 slices shown]
[im 31/155  lung]
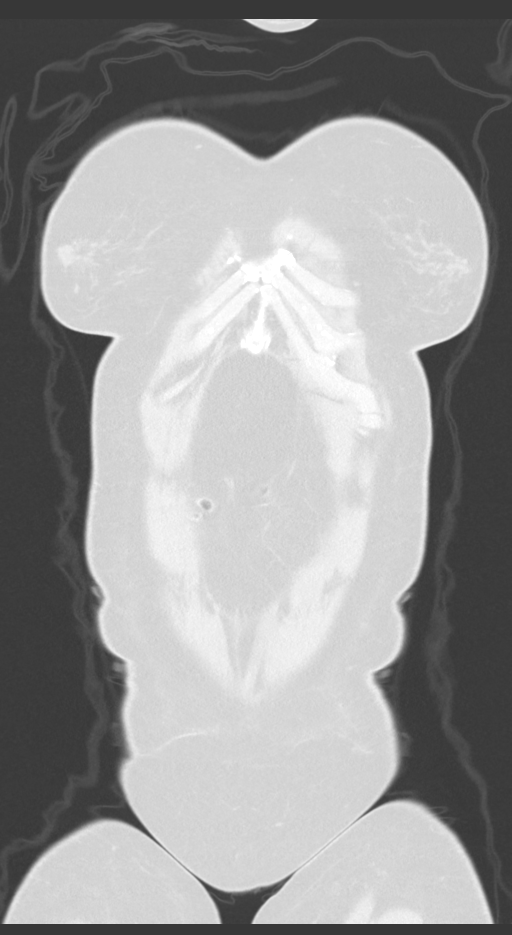
[im 62/155  lung]
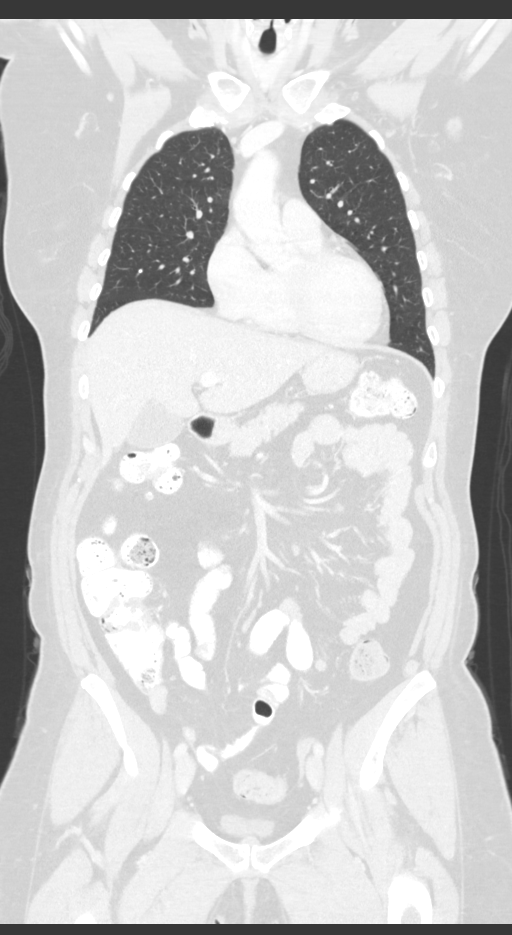
[im 93/155  lung]
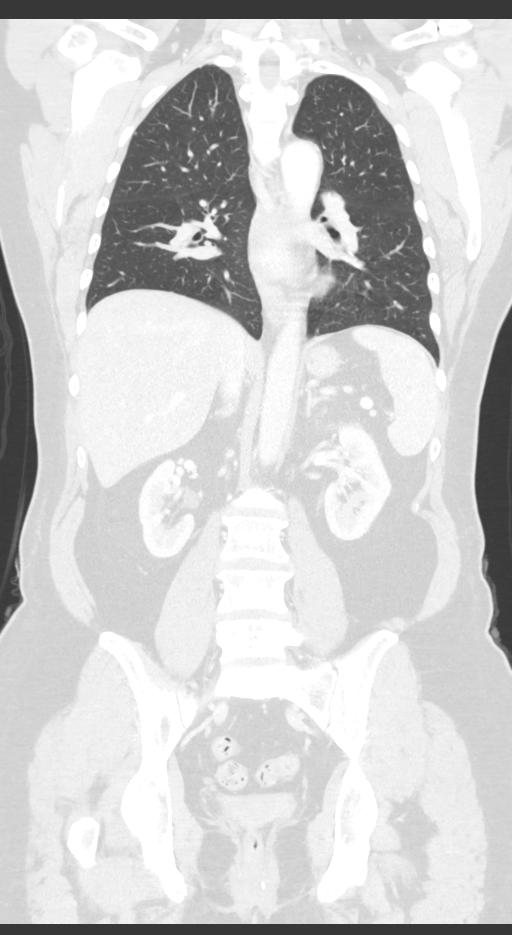

[13 of 36 positions shown; findings below may reference images not displayed]

FINDINGS: CT CHEST FINDINGS

Cardiovascular: Vascular structures are unremarkable. Heart size
normal. No pericardial effusion.

Mediastinum/Nodes: Probable 10 mm exophytic thyroid nodule anterior
to the trachea (series 2, image 14). No pathologically enlarged
mediastinal, hilar or axillary lymph nodes. Esophagus is grossly
unremarkable.

Lungs/Pleura: A few millimetric pulmonary nodules are peripheral
location and likely benign. No pleural fluid. Airway is
unremarkable.

Musculoskeletal: Mild degenerative changes in the spine. No
worrisome lytic or sclerotic lesions.

CT ABDOMEN PELVIS FINDINGS

Hepatobiliary: Liver and gallbladder are unremarkable. No biliary
ductal dilatation.

Pancreas: Negative.

Spleen: Negative.

Adrenals/Urinary Tract: Adrenal glands and kidneys are unremarkable.
Ureters are decompressed. Bladder is low in volume.

Stomach/Bowel: Stomach, small bowel, appendix and colon are
unremarkable.

Vascular/Lymphatic: Vascular structures are unremarkable. No
pathologically enlarged lymph nodes.

Reproductive: Uterus is visualized.  No adnexal mass.

Other: No free fluid.  Mesenteries and peritoneum are unremarkable.

Musculoskeletal: No worrisome lytic or sclerotic lesions.
IMPRESSION: No findings to explain the patient's symptoms.

## 2020-12-05 ENCOUNTER — Other Ambulatory Visit: Payer: Self-pay | Admitting: Physician Assistant

## 2020-12-05 DIAGNOSIS — R10813 Right lower quadrant abdominal tenderness: Secondary | ICD-10-CM

## 2020-12-05 DIAGNOSIS — R10821 Right upper quadrant rebound abdominal tenderness: Secondary | ICD-10-CM

## 2020-12-06 ENCOUNTER — Other Ambulatory Visit: Payer: Self-pay

## 2020-12-06 ENCOUNTER — Ambulatory Visit
Admission: RE | Admit: 2020-12-06 | Discharge: 2020-12-06 | Disposition: A | Discharge status: Home / Self Care | Payer: Managed Care, Other (non HMO) | Source: Ambulatory Visit | Attending: Physician Assistant | Admitting: Physician Assistant

## 2020-12-06 DIAGNOSIS — R10821 Right upper quadrant rebound abdominal tenderness: Secondary | ICD-10-CM

## 2020-12-06 DIAGNOSIS — R10813 Right lower quadrant abdominal tenderness: Secondary | ICD-10-CM

## 2020-12-06 MED ORDER — IOPAMIDOL (ISOVUE-300) INJECTION 61%
100.0000 mL | Freq: Once | INTRAVENOUS | Status: AC | PRN
  Administered 2020-12-06: 100 mL via INTRAVENOUS

## 2021-01-24 ENCOUNTER — Encounter: Payer: Self-pay | Admitting: Physician Assistant

## 2021-01-24 ENCOUNTER — Other Ambulatory Visit: Payer: Self-pay

## 2021-01-24 ENCOUNTER — Ambulatory Visit: Payer: Managed Care, Other (non HMO) | Admitting: Physician Assistant

## 2021-01-24 DIAGNOSIS — L309 Dermatitis, unspecified: Secondary | ICD-10-CM | POA: Diagnosis not present

## 2021-01-24 DIAGNOSIS — L570 Actinic keratosis: Secondary | ICD-10-CM

## 2021-01-24 DIAGNOSIS — L821 Other seborrheic keratosis: Secondary | ICD-10-CM | POA: Diagnosis not present

## 2021-01-24 DIAGNOSIS — Z1283 Encounter for screening for malignant neoplasm of skin: Secondary | ICD-10-CM | POA: Diagnosis not present

## 2021-01-24 DIAGNOSIS — D485 Neoplasm of uncertain behavior of skin: Secondary | ICD-10-CM

## 2021-01-24 MED ORDER — LEVOCETIRIZINE DIHYDROCHLORIDE 5 MG PO TABS: 5.0000 mg | ORAL_TABLET | Freq: Every day | ORAL | 4 refills | Status: DC

## 2021-01-24 MED ORDER — DESONIDE 0.05 % EX CREA: TOPICAL_CREAM | Freq: Every day | CUTANEOUS | 8 refills | Status: DC

## 2021-01-24 NOTE — Progress Notes (Signed)
   Follow-Up Visit   Subjective  Vickie Perry is a 60 y.o. female who presents for the following: Annual Exam (Under left breast raised).   The following portions of the chart were reviewed this encounter and updated as appropriate:  Tobacco  Allergies  Meds  Problems  Med Hx  Surg Hx  Fam Hx      Objective  Well appearing patient in no apparent distress; mood and affect are within normal limits.  A full examination was performed including scalp, head, eyes, ears, nose, lips, neck, chest, axillae, abdomen, back, buttocks, bilateral upper extremities, bilateral lower extremities, hands, feet, fingers, toes, fingernails, and toenails. All findings within normal limits unless otherwise noted below.  Objective  Head to toe: Full body skin examination  Objective  Left Inframammary Fold, Right Inframammary Fold: Stuck-on, waxy papules and plaques.   Objective  Mid Lower Vermilion Lip: white pearly plaque     Objective  Left Eyebrow, Right Eyebrow: Clear today  Assessment & Plan  Encounter for screening for malignant neoplasm of skin Head to toe  Yearly skin check  Seborrheic keratosis (2) Left Inframammary Fold; Right Inframammary Fold  observe  Neoplasm of uncertain behavior of skin Mid Lower Vermilion Lip  Skin / nail biopsy Type of biopsy: tangential   Informed consent: discussed and consent obtained   Timeout: patient name, date of birth, surgical site, and procedure verified   Procedure prep:  Patient was prepped and draped in usual sterile fashion (Non sterile) Prep type:  Chlorhexidine Anesthesia: the lesion was anesthetized in a standard fashion   Anesthetic:  1% lidocaine w/ epinephrine 1-100,000 local infiltration Instrument used: flexible razor blade   Outcome: patient tolerated procedure well   Post-procedure details: wound care instructions given    Specimen 1 - Surgical pathology Differential Diagnosis: bcc vs scc  Check Margins:  No  Dermatitis (2) Left Eyebrow; Right Eyebrow  Ordered Medications: desonide (DESOWEN) 0.05 % cream  Reordered Medications levocetirizine (XYZAL) 5 MG tablet   I, Lakoda Raske, PA-C, have reviewed all documentation's for this visit.  The documentation on 01/24/21 for the exam, diagnosis, procedures and orders are all accurate and complete.

## 2021-01-24 NOTE — Patient Instructions (Signed)
Seborrheic Keratosis A seborrheic keratosis is a common, noncancerous (benign) skin growth. These growths are velvety, waxy, rough, tan, brown, or black spots that appear on the skin. These skin growths can be flat or raised, and scaly. What are the causes? The cause of this condition is not known. What increases the risk? You are more likely to develop this condition if you:  Have a family history of seborrheic keratosis.  Are 50 or older.  Are pregnant.  Have had estrogen replacement therapy. What are the signs or symptoms? Symptoms of this condition include growths on the face, chest, shoulders, back, or other areas. These growths:  Are usually painless, but may become irritated and itchy.  Can be yellow, brown, black, or other colors.  Are slightly raised or have a flat surface.  Are sometimes rough or wart-like in texture.  Are often velvety or waxy on the surface.  Are round or oval-shaped.  Often occur in groups, but may occur as a single growth.   How is this diagnosed? This condition is diagnosed with a medical history and physical exam.  A sample of the growth may be tested (skin biopsy).  You may need to see a skin specialist (dermatologist). How is this treated? Treatment is not usually needed for this condition, unless the growths are irritated or bleed often.  You may also choose to have the growths removed if you do not like their appearance. ? Most commonly, these growths are treated with a procedure in which liquid nitrogen is applied to "freeze" off the growth (cryosurgery). ? They may also be burned off with electricity (electrocautery) or removed by scraping (curettage). Follow these instructions at home:  Watch your growth for any changes.  Keep all follow-up visits as told by your health care provider. This is important.  Do not scratch or pick at the growth or growths. This can cause them to become irritated or infected. Contact a health care  provider if:  You suddenly have many new growths.  Your growth bleeds, itches, or hurts.  Your growth suddenly becomes larger or changes color. Summary  A seborrheic keratosis is a common, noncancerous (benign) skin growth.  Treatment is not usually needed for this condition, unless the growths are irritated or bleed often.  Watch your growth for any changes.  Contact a health care provider if you suddenly have many new growths or your growth suddenly becomes larger or changes color.  Keep all follow-up visits as told by your health care provider. This is important. This information is not intended to replace advice given to you by your health care provider. Make sure you discuss any questions you have with your health care provider. Document Revised: 03/06/2018 Document Reviewed: 03/06/2018 Elsevier Patient Education  2021 Elsevier Inc.  

## 2021-02-01 ENCOUNTER — Telehealth: Payer: Self-pay | Admitting: *Deleted

## 2021-02-01 NOTE — Telephone Encounter (Signed)
-----   Message from Warren Danes, [place] sent at 02/01/2021  3:44 PM EDT ----- Precancerous cells.... recheck 30 months

## 2021-02-01 NOTE — Telephone Encounter (Signed)
Pathology to patient- 3 month follow up appointment scheduled.

## 2021-04-17 ENCOUNTER — Ambulatory Visit: Payer: Managed Care, Other (non HMO) | Admitting: Physician Assistant

## 2021-04-19 ENCOUNTER — Ambulatory Visit: Payer: Managed Care, Other (non HMO) | Admitting: Dermatology

## 2021-04-19 ENCOUNTER — Other Ambulatory Visit: Payer: Self-pay

## 2021-04-19 ENCOUNTER — Encounter: Payer: Self-pay | Admitting: Dermatology

## 2021-04-19 DIAGNOSIS — L570 Actinic keratosis: Secondary | ICD-10-CM

## 2021-04-19 DIAGNOSIS — K130 Diseases of lips: Secondary | ICD-10-CM

## 2021-04-23 ENCOUNTER — Encounter: Payer: Self-pay | Admitting: Dermatology

## 2021-04-23 NOTE — Progress Notes (Signed)
   Follow-Up Visit   Subjective  Vickie Perry is a 60 y.o. female who presents for the following: Skin Problem (Patient here today for follow up on mid lower vermilion lip. Per patient the lesion has returned some and now she has a new lesion on her top left lip x 2 months no bleeding. Recheck left upper eyebrow was previously LN2).  Recheck lips and frozen spot left eyebrow Location:  Duration:  Quality:  Associated Signs/Symptoms: Modifying Factors:  Severity:  Timing: Context:   Objective  Well appearing patient in no apparent distress; mood and affect are within normal limits. Left Upper Vermilion Lip, Mid Lower Vermilion Lip There is a subtle white 3 x 33mm dermal discoloration on lower lip at the site of the freezing in a similar spot on the left upper lip where no treatment was done.  There are no intraoral changes or changes on the wrists or nails.  The lips are absolutely smooth to touch.  This may represent localized lichen planus rather than UV damage.  We discussed obtaining confirmatory biopsies but we both felt that this is best deferred unless there is definite clinical change.       Left Forehead Subtly gritty pink patch in a young woman with in 6 months along with her lip and decide whether to use topical [place].    A focused examination was performed including the head and neck.. Relevant physical exam findings are noted in the Assessment and Plan.   Assessment & Plan    Inflammation of lips (2) Left Upper Vermilion Lip; Mid Lower Vermilion Lip  Recheck lips in 6 months.  If she feels there is absolutely no change, she may cancel that visit.  AK (actinic keratosis) Left Forehead  No treatment today. Possible topical therapy or light therapy in the Fall.      I, Lavonna Monarch, MD, have reviewed all documentation for this visit.  The documentation on 04/23/21 for the exam, diagnosis, procedures, and orders are all accurate and complete.

## 2021-09-04 NOTE — Progress Notes (Signed)
60 y.o. [id number] Married White or Caucasian Not Hispanic or Latino female here for annual exam.    Last year she had an ultrasound for postcoital bleeding. U/S was normal. Bleeding was felt to be from atrophy, she was treated with vaginal estrogen. She used the vaginal estrogen for 6 months, didn't bleed during that time. She ended up stopping it, didn't want the medication. They have sex 1-2 x a month. Uses a lubricant, off of the estrogen she bleeds ~1/2 the time, feels like she tears. Tolerable. No other vaginal bleeding.  No bowel or bladder c/o. Occasional constipation, helped with OTC agents.    H/O panic disorder, completely controlled on SSRI's.    H/O genital hsv, takes prn medication. Only 1-2 outbreaks in the last year.   Patient's last menstrual period was 03/17/2013 (exact date).          Sexually active: Yes.    The current method of family planning is post menopausal status.    Exercising: Yes.     Walking  Smoker:  no  Health Maintenance: Pap:   08/19/19 negative; 07-23-18 neg HPV HR neg History of abnormal Pap:  no MMG:  09/07/20 Bi-rads 1 neg, scheduled next week.   BMD:   none  Colonoscopy: 2015 f/u 10 years  TDaP:  2017  Gardasil: n/a   reports that she has never smoked. She has never used smokeless tobacco. She reports current alcohol use of about 3.0 standard drinks per week. She reports that she does not use drugs. 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 Married x 33 years. Kids are 27 and 30. Has 2 grandchildren.   Past Medical History:  Diagnosis Date   Allergic rhinitis    Anxiety    Asthma    Depression    2005   Dyslipidemia    Edema in pregnancy    Exercise induced bronchospasm    Family history of breast cancer    maternal second cousin   Family history of early CAD 07/04/2017   Family history of lung cancer    Family history of prostate cancer    Family history of thyroid cancer    HSV-2 (herpes  simplex virus 2) infection    Hypertension    Hypophysitis ([place])    H/O Autoimmune   Mitral regurgitation    Palpitations    Panic attack    Perennial allergic rhinitis    PIH (pregnancy induced hypertension)    P partum// severe    Tonsillar calculus 05/2012   Dr Orpah Greek bates   Torn meniscus     Past Surgical History:  Procedure Laterality Date   ABLATION  2007   HTA   CESAREAN SECTION     x 2// Due to HSV2   FOOT SURGERY     KNEE SURGERY Left 2019   plastic eye surgery      Current Outpatient Medications  Medication Sig Dispense Refill   albuterol (PROVENTIL HFA;VENTOLIN HFA) 108 (90 BASE) MCG/ACT inhaler Inhale 2 puffs into the lungs every 4 (four) hours as needed for wheezing or shortness of breath.      Ascorbic Acid (VITAMIN C) 500 MG CAPS See admin instructions.     aspirin 81 MG tablet Take 81 mg by mouth daily.     Cholecalciferol ([employer] D3) 50 MCG (2000 UT) CAPS Take 1 capsule by mouth daily.     Clindamycin-Benzoyl Per, Refr, gel Reported on 11/17/2015  6  clonazePAM (KLONOPIN) 1 MG tablet Take 1 mg by mouth daily.     desonide (DESOWEN) 0.05 % cream Apply topically daily. 60 g 8   fluticasone (FLONASE) 50 MCG/ACT nasal spray 1 2 SPRAY IN EACH NOSTRIL TWICE A DAY NASALLY 30 DAYS     ipratropium (ATROVENT) 0.06 % nasal spray Place 2 sprays into both nostrils 2 (two) times daily.     levocetirizine (XYZAL) 5 MG tablet Take 1 tablet (5 mg total) by mouth at bedtime. 90 tablet 4   QVAR 80 MCG/ACT inhaler Inhale 2 puffs into the lungs as needed.   5   rosuvastatin (CRESTOR) 10 MG tablet Take 5 mg by mouth daily.     sertraline (ZOLOFT) 50 MG tablet take 1/2 daily  1   Spacer/Aero-Holding [facility] DIAMOND) MISC as directed.     tretinoin (RETIN-A) 0.05 % cream   9   triamcinolone cream (KENALOG) 0.1 % Apply 1 application topically as needed. Reported on 11/17/2015     valACYclovir (VALTREX) 500 MG tablet Take one tablet po bid x 3 days prn 30 tablet 2    valsartan (DIOVAN) 160 MG tablet Take 80 mg by mouth daily.     VITAMIN D PO Take 2,000 Int'l Units by mouth daily.     No current facility-administered medications for this visit.    Family History  Problem Relation Age of Onset   Prostate cancer Father 23       metastatic   Heart disease Father    Hyperlipidemia Brother    Thyroid cancer Brother 69   Heart disease Mother    Heart disease Maternal Grandfather    Cancer Paternal Grandfather        unknown type   Heart disease Brother    Hyperlipidemia Brother    Heart disease Brother    Hyperlipidemia Brother    Hyperlipidemia Brother    Hyperlipidemia Brother    Alzheimer's disease Maternal Grandmother    Thyroid cancer Paternal Grandmother        dx 40s   Lung cancer Paternal Aunt        smoker   Cancer Other        unknown type, dx late 60s, maternal great-aunt   Cancer Maternal Uncle        unknown type, dx 77s   Cancer Paternal Uncle        unknown type, dx >50   Cancer Paternal Uncle        unknown type, dx >50   Breast cancer Other 29       maternal second cousin    Review of Systems  All other systems reviewed and are negative.  Exam:   BP 136/80   Pulse 88   Ht 5\' 7"  (1.702 m)   LMP 03/17/2013 (Exact Date)   SpO2 99%   BMI 31.43 kg/m   Weight change: @WEIGHTCHANGE @ Height:   Height: 5\' 7"  (170.2 cm)  Ht Readings from Last 3 Encounters:  09/06/21 5\' 7"  (1.702 m)  09/21/20 5\' 6"  (1.676 m)  09/01/20 5\' 6"  (1.676 m)    General appearance: alert, cooperative and appears stated age Head: Normocephalic, without obvious abnormality, atraumatic Neck: no adenopathy, supple, symmetrical, trachea midline and thyroid normal to inspection and palpation Lungs: clear to auscultation bilaterally Cardiovascular: regular rate and rhythm Breasts: normal appearance, no masses or tenderness Abdomen: soft, non-tender; non distended,  no masses,  no organomegaly Extremities: extremities normal, atraumatic, no cyanosis  or edema Skin: Skin  color, texture, turgor normal. No rashes or lesions Lymph nodes: Cervical, supraclavicular, and axillary nodes normal. No abnormal inguinal nodes palpated Neurologic: Grossly normal   Pelvic: External genitalia:  no lesions              Urethra:  normal appearing urethra with no masses, tenderness or lesions              Bartholins and Skenes: normal                 Vagina: normal appearing vagina with normal color and discharge, no lesions              Cervix: no lesions               Bimanual Exam:  Uterus:   no masses or tenderness              Adnexa: no mass, fullness, tenderness               Rectovaginal: Confirms               Anus:  normal sphincter tone, no lesions  Gae Dry chaperoned for the exam.  1. Well woman exam Discussed breast self exam Discussed calcium and vit D intake Mammogram next week Colonoscopy UTD  2. Postcoital bleeding C/W tearing Use uberlube Declines vaginal estrogen  3. History of herpes genitalis - valACYclovir (VALTREX) 500 MG tablet; Take one tablet po bid x 3 days prn  Dispense: 30 tablet; Refill: 2  4. Screening for cervical cancer Desires pap - Cytology - PAP

## 2021-09-06 ENCOUNTER — Other Ambulatory Visit (HOSPITAL_COMMUNITY)
Admission: RE | Admit: 2021-09-06 | Discharge: 2021-09-06 | Disposition: A | Discharge status: Home / Self Care | Payer: Managed Care, Other (non HMO) | Source: Ambulatory Visit | Attending: Obstetrics and Gynecology | Admitting: Obstetrics and Gynecology

## 2021-09-06 ENCOUNTER — Encounter: Payer: Self-pay | Admitting: Obstetrics and Gynecology

## 2021-09-06 ENCOUNTER — Other Ambulatory Visit: Payer: Self-pay

## 2021-09-06 ENCOUNTER — Ambulatory Visit (INDEPENDENT_AMBULATORY_CARE_PROVIDER_SITE_OTHER): Payer: Managed Care, Other (non HMO) | Admitting: Obstetrics and Gynecology

## 2021-09-06 VITALS — BP 136/80 | HR 88 | Ht 67.0 in

## 2021-09-06 DIAGNOSIS — Z8619 Personal history of other infectious and parasitic diseases: Secondary | ICD-10-CM

## 2021-09-06 DIAGNOSIS — Z124 Encounter for screening for malignant neoplasm of cervix: Secondary | ICD-10-CM | POA: Insufficient documentation

## 2021-09-06 DIAGNOSIS — N930 Postcoital and contact bleeding: Secondary | ICD-10-CM

## 2021-09-06 DIAGNOSIS — Z01419 Encounter for gynecological examination (general) (routine) without abnormal findings: Secondary | ICD-10-CM | POA: Diagnosis not present

## 2021-09-06 MED ORDER — VALACYCLOVIR HCL 500 MG PO TABS: ORAL_TABLET | ORAL | 2 refills | Status: DC

## 2021-09-06 NOTE — Patient Instructions (Signed)

## 2021-09-07 LAB — CYTOLOGY - PAP
Comment: NEGATIVE
Diagnosis: NEGATIVE
High risk HPV: NEGATIVE

## 2021-09-15 ENCOUNTER — Other Ambulatory Visit: Payer: Self-pay | Admitting: Obstetrics and Gynecology

## 2021-09-15 DIAGNOSIS — Z8619 Personal history of other infectious and parasitic diseases: Secondary | ICD-10-CM

## 2021-09-20 ENCOUNTER — Encounter: Payer: Self-pay | Admitting: Obstetrics and Gynecology

## 2021-09-26 DIAGNOSIS — M7502 Adhesive capsulitis of left shoulder: Secondary | ICD-10-CM | POA: Insufficient documentation

## 2021-10-18 ENCOUNTER — Ambulatory Visit: Payer: Managed Care, Other (non HMO) | Admitting: Physician Assistant

## 2022-01-09 ENCOUNTER — Other Ambulatory Visit: Payer: Self-pay | Admitting: Internal Medicine

## 2022-01-09 DIAGNOSIS — R5381 Other malaise: Secondary | ICD-10-CM

## 2022-01-24 ENCOUNTER — Ambulatory Visit (INDEPENDENT_AMBULATORY_CARE_PROVIDER_SITE_OTHER): Payer: 59 | Admitting: Physician Assistant

## 2022-01-24 ENCOUNTER — Other Ambulatory Visit: Payer: Self-pay

## 2022-01-24 ENCOUNTER — Encounter: Payer: Self-pay | Admitting: Physician Assistant

## 2022-01-24 DIAGNOSIS — L570 Actinic keratosis: Secondary | ICD-10-CM

## 2022-01-24 DIAGNOSIS — Z1283 Encounter for screening for malignant neoplasm of skin: Secondary | ICD-10-CM

## 2022-01-24 DIAGNOSIS — L309 Dermatitis, unspecified: Secondary | ICD-10-CM | POA: Diagnosis not present

## 2022-01-24 DIAGNOSIS — R202 Paresthesia of skin: Secondary | ICD-10-CM | POA: Diagnosis not present

## 2022-01-24 DIAGNOSIS — L700 Acne vulgaris: Secondary | ICD-10-CM

## 2022-01-24 DIAGNOSIS — R21 Rash and other nonspecific skin eruption: Secondary | ICD-10-CM

## 2022-01-24 MED ORDER — CLINDAMYCIN PHOSPHATE 1 % EX LOTN: TOPICAL_LOTION | Freq: Every day | CUTANEOUS | 8 refills | Status: AC

## 2022-01-24 MED ORDER — TRETINOIN 0.05 % EX CREA: TOPICAL_CREAM | Freq: Every day | CUTANEOUS | 9 refills | Status: AC

## 2022-01-24 MED ORDER — DESONIDE 0.05 % EX CREA: TOPICAL_CREAM | Freq: Every day | CUTANEOUS | 8 refills | Status: DC

## 2022-01-24 MED ORDER — TRIAMCINOLONE ACETONIDE 0.1 % EX CREA: 1.0000 "application " | TOPICAL_CREAM | CUTANEOUS | 8 refills | Status: AC | PRN

## 2022-01-24 MED ORDER — LEVOCETIRIZINE DIHYDROCHLORIDE 5 MG PO TABS: 5.0000 mg | ORAL_TABLET | Freq: Every day | ORAL | 4 refills | Status: DC

## 2022-01-30 ENCOUNTER — Ambulatory Visit: Payer: Self-pay | Admitting: Podiatry

## 2022-01-31 DIAGNOSIS — M19079 Primary osteoarthritis, unspecified ankle and foot: Secondary | ICD-10-CM | POA: Insufficient documentation

## 2022-01-31 DIAGNOSIS — F419 Anxiety disorder, unspecified: Secondary | ICD-10-CM | POA: Insufficient documentation

## 2022-01-31 DIAGNOSIS — I441 Atrioventricular block, second degree: Secondary | ICD-10-CM | POA: Insufficient documentation

## 2022-01-31 DIAGNOSIS — E785 Hyperlipidemia, unspecified: Secondary | ICD-10-CM | POA: Insufficient documentation

## 2022-01-31 DIAGNOSIS — K219 Gastro-esophageal reflux disease without esophagitis: Secondary | ICD-10-CM | POA: Insufficient documentation

## 2022-02-12 ENCOUNTER — Other Ambulatory Visit: Payer: Self-pay | Admitting: Internal Medicine

## 2022-02-12 DIAGNOSIS — E2839 Other primary ovarian failure: Secondary | ICD-10-CM

## 2022-02-14 ENCOUNTER — Encounter: Payer: Self-pay | Admitting: Physician Assistant

## 2022-02-14 NOTE — Progress Notes (Signed)
? ?  Follow-Up Visit ?  ?Subjective  ?Vickie Perry is a 61 y.o. female who presents for the following: Annual Exam (Recheck white spot on the top lip [place] checks this every year. New lesion left calf her pcp started LN2 but seems to have missed a spot. Left post shoulder lesion she is unsure how long its been there no bleeding. And many refills see skin exam ). ? ? ?The following portions of the chart were reviewed this encounter and updated as appropriate:  Tobacco  Allergies  Meds  Problems  Med Hx  Surg Hx  Fam Hx   ?  ? ?Objective  ?Well appearing patient in no apparent distress; mood and affect are within normal limits. ? ?A full examination was performed including scalp, head, eyes, ears, nose, lips, neck, chest, axillae, abdomen, back, buttocks, bilateral upper extremities, bilateral lower extremities, hands, feet, fingers, toes, fingernails, and toenails. All findings within normal limits unless otherwise noted below. ? ?Right Upper Back ?Both shoulders itch no rash present  ? ? ?Assessment & Plan  ?Acne vulgaris ?Head - Anterior (Face) ? ?clindamycin (CLEOCIN-T) 1 % lotion - Head - Anterior (Face) ?Apply topically daily. ? ?Related Medications ?tretinoin (RETIN-A) 0.05 % cream ?Apply topically at bedtime. ? ?Dermatitis ?Head - Anterior (Face) ? ?Related Medications ?levocetirizine (XYZAL) 5 MG tablet ?Take 1 tablet (5 mg total) by mouth at bedtime. ? ?desonide (DESOWEN) 0.05 % cream ?Apply topically daily. ? ?Notalgia paresthetica ?Right Upper Back ? ?Rash ?Left Hip (side) - Posterior ? ?AK (actinic keratosis) (2) ?Left Hand - Posterior; Left Lower Leg - Posterior ? ?Destruction of lesion - Left Hand - Posterior, Left Lower Leg - Posterior ?Complexity: simple   ?Destruction method: cryotherapy   ?Informed consent: discussed and consent obtained   ?Timeout:  patient name, date of birth, surgical site, and procedure verified ?Lesion destroyed using liquid nitrogen: Yes   ?Cryotherapy cycles:   3 ?Outcome: patient tolerated procedure well with no complications   ?Post-procedure details: wound care instructions given   ? ? ? ?I, Charlaine Utsey, PA-C, have reviewed all documentation's for this visit.  The documentation on 02/14/22 for the exam, diagnosis, procedures and orders are all accurate and complete. ?

## 2022-02-20 ENCOUNTER — Ambulatory Visit (INDEPENDENT_AMBULATORY_CARE_PROVIDER_SITE_OTHER): Payer: 59 | Admitting: Sports Medicine

## 2022-02-20 VITALS — BP 128/51 | Ht 67.0 in | Wt 190.0 lb

## 2022-02-20 DIAGNOSIS — R269 Unspecified abnormalities of gait and mobility: Secondary | ICD-10-CM | POA: Diagnosis not present

## 2022-02-20 DIAGNOSIS — M2142 Flat foot [pes planus] (acquired), left foot: Secondary | ICD-10-CM

## 2022-02-20 DIAGNOSIS — Z9889 Other specified postprocedural states: Secondary | ICD-10-CM

## 2022-02-20 DIAGNOSIS — M2141 Flat foot [pes planus] (acquired), right foot: Secondary | ICD-10-CM | POA: Diagnosis not present

## 2022-02-20 DIAGNOSIS — M216X9 Other acquired deformities of unspecified foot: Secondary | ICD-10-CM

## 2022-02-20 NOTE — Progress Notes (Signed)
Subjective: Vickie Perry is a pleasant 61 year old female who presents today for custom orthotics.  She has a history of right bunionectomy on the right foot.  She does have a history of flatfeet.  She is hoping to get good relief from orthotics today. Would like 2 pairs of orthotics today. ? ?Last orthotics before this were 2016.  Pain much improved with use. ? ?Patient was fitted for a: standard, cushioned, semi-rigid orthotic. ?The orthotic was heated and afterward the patient stood on the orthotic blank positioned on the orthotic stand. ?The patient was positioned in subtalar neutral position and 10 degrees of ankle dorsiflexion in a weight bearing stance. ?After completion of molding, a stable base was applied to the orthotic blank. ?The blank was ground to a stable position for weight bearing. ?Size: 8 ?Base: blue EVA ?Posting: 1st ray posting on R-only; medial heel wedge R-only ?Additional orthotic padding: Small metatarsal pad right only  ? ?Patient was fitted for a: standard, cushioned, semi-rigid orthotic. ?The orthotic was heated and afterward the patient stood on the orthotic blank positioned on the orthotic stand. ?The patient was positioned in subtalar neutral position and 10 degrees of ankle dorsiflexion in a weight bearing stance. ?After completion of molding, a stable base was applied to the orthotic blank. ?The blank was ground to a stable position for weight bearing. ?Size: 8 ?Base: blue EVA ?Posting: 1st ray posting on R-only; medial heel wedge R-only ?Additional orthotic padding: Small metatarsal pad right only  ? ?Total time spent with the patient was 30 minutes with greater than 50% of the time spent in face-to-face consultation discussing orthotic construction, instruction, and sitting. Gait was neutral with orthotics in place. Patient found them to be comfortable. Follow-up as needed. ? ?Elba Barman, DO ?PGY-4, Sports Medicine Fellow ?[place] ? ?This note was dictated  using Dragon naturally speaking software and may contain errors in syntax, spelling, or content which have not been identified prior to signing this note.  ? ?I observed and examined the patient with the resident and agree with assessment and plan.  Note reviewed and modified by me. ?Ila Mcgill, MD ?

## 2022-03-13 DIAGNOSIS — M1712 Unilateral primary osteoarthritis, left knee: Secondary | ICD-10-CM | POA: Insufficient documentation

## 2022-03-29 ENCOUNTER — Ambulatory Visit
Admission: RE | Admit: 2022-03-29 | Discharge: 2022-03-29 | Disposition: A | Discharge status: Home / Self Care | Payer: 59 | Source: Ambulatory Visit | Attending: Internal Medicine | Admitting: Internal Medicine

## 2022-03-29 DIAGNOSIS — E2839 Other primary ovarian failure: Secondary | ICD-10-CM

## 2022-08-28 ENCOUNTER — Encounter: Payer: Self-pay | Admitting: Adult Health

## 2022-08-28 ENCOUNTER — Ambulatory Visit (INDEPENDENT_AMBULATORY_CARE_PROVIDER_SITE_OTHER): Payer: 59 | Admitting: Adult Health

## 2022-08-28 VITALS — BP 118/63 | HR 73 | Ht 67.0 in

## 2022-08-28 DIAGNOSIS — F331 Major depressive disorder, recurrent, moderate: Secondary | ICD-10-CM

## 2022-08-28 MED ORDER — BUPROPION HCL ER (XL) 150 MG PO TB24: ORAL_TABLET | ORAL | 5 refills | Status: DC

## 2022-08-28 NOTE — Progress Notes (Addendum)
[facility] MD/PA/NP Initial Note  08/28/2022 2:31 PM Vickie Perry  MRN:  [id number]  Chief Complaint:   HPI:  Patient seen today for initial psychiatric evaluation.   Describes mood today as "not the best". Pleasant. Flat. Denies tearfulness. Mood symptoms - reports increased depression, anxiety and irritability. Reports worry and rumination. Reports over thinking. Mood is lower. Stating "I feel depressed". Reports worsening of symptoms starting in April. 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 She is the youngest of 7 siblings and the last sibling working - all other siblings have retired. The nieces and nephews are now coming of age to take over the business and she is not ready to relinquish her role in the company. She has been getting a lot of negativity from siblings and family members. Stating "I'm not ready to retire". Would like to continue working until she decides she is ready to retire. Feels like all the additional stress associated with the situation has caused her mood to decline. Currently followed by GP for psychiatric medications and is taking Zoloft '50mg'$  and Clonazepam 0.'5mg'$  at bedtime. Feels like the medications are helpful generally, but not so much with all the additional stressors. Willing to consider other treatment options. Decreased interest and motivation. Taking medications as prescribed. Energy levels stable. Active, has a regular exercise routine.  Enjoys some usual interests and activities. Married x 34 years. Has 2 children. Spending time with family. Appetite adequate. Weight stable. Sleeps better some nights than others. Averages 6 to 8 hours with Melatonin '10mg'$  at hs. Focus and concentration stable. Completing tasks. Managing aspects of household. 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  Denies SI or HI.  Denies AH or VH. Denies self harm.  Denies substance use.   Previous medication trials:  Prozac, Lexapro   Visit  Diagnosis:    ICD-10-CM   1. Major depressive disorder, recurrent episode, moderate (HCC)  F33.1       Past Psychiatric History: Denies psychiatric hospitalization.   Past Medical History:  Past Medical History:  Diagnosis Date   Allergic rhinitis    Anxiety    Asthma    Depression    2005   Dyslipidemia    Edema in pregnancy    Exercise induced bronchospasm    Family history of breast cancer    maternal second cousin   Family history of early CAD 07/04/2017   Family history of lung cancer    Family history of prostate cancer    Family history of thyroid cancer    HSV-2 (herpes simplex virus 2) infection    Hypertension    Hypophysitis ([place])    H/O Autoimmune   Mitral regurgitation    Palpitations    Panic attack    Perennial allergic rhinitis    PIH (pregnancy induced hypertension)    P partum// severe    Tonsillar calculus 05/2012   Dr Orpah Greek bates   Torn meniscus     Past Surgical History:  Procedure Laterality Date   ABLATION  2007   HTA   CESAREAN SECTION     x 2// Due to HSV2   FOOT SURGERY     KNEE SURGERY Left 2019   plastic eye surgery      Family Psychiatric History: Denies any family history of mental illness.   Family History:  Family History  Problem Relation Age of Onset   Prostate cancer Father 22       metastatic   Heart disease Father  Hyperlipidemia Brother    Thyroid cancer Brother 95   Heart disease Mother    Heart disease Maternal Grandfather    Cancer Paternal Grandfather        unknown type   Heart disease Brother    Hyperlipidemia Brother    Heart disease Brother    Hyperlipidemia Brother    Hyperlipidemia Brother    Hyperlipidemia Brother    Alzheimer's disease Maternal Grandmother    Thyroid cancer Paternal Grandmother        dx 86s   Lung cancer Paternal Aunt        smoker   Cancer Other        unknown type, dx late 67s, maternal great-aunt   Cancer Maternal Uncle        unknown type, dx 44s   Cancer Paternal  Uncle        unknown type, dx >50   Cancer Paternal Uncle        unknown type, dx >50   Breast cancer Other 39       maternal second cousin    Social History:  Social History   Socioeconomic History   Marital status: Married    Spouse name: Not on file   Number of children: Not on file   Years of education: Not on file   Highest education level: Not on file  Occupational History   Not on file  Tobacco Use   Smoking status: Never   Smokeless tobacco: Never  Vaping Use   Vaping Use: Never used  Substance and Sexual Activity   Alcohol use: Yes    Alcohol/week: 3.0 standard drinks of alcohol    Types: 3 Standard drinks or equivalent per week   Drug use: No   Sexual activity: Yes    Partners: Male    Birth control/protection: Other-see comments, Post-menopausal    Comment: vasectomy  Other Topics Concern   Not on file  Social History Narrative   Not on file   Social Determinants of Health   Financial Resource Strain: Not on file  Food Insecurity: Not on file  Transportation Needs: Not on file  Physical Activity: Not on file  Stress: Not on file  Social Connections: Not on file    Allergies:  Allergies  Allergen Reactions   Erythromycin Anaphylaxis and Other (See Comments)    Metabolic Disorder Labs: Lab Results  Component Value Date   HGBA1C 5.8 (H) 07/23/2018   MPG 131 03/27/2016   Lab Results  Component Value Date   PROLACTIN 5.4 11/30/2014   Lab Results  Component Value Date   CHOL 172 03/27/2016   TRIG 164 (H) 03/27/2016   HDL 58 03/27/2016   CHOLHDL 3.0 03/27/2016   VLDL 33 (H) 03/27/2016   LDLCALC 81 03/27/2016   Lab Results  Component Value Date   TSH 0.461 08/19/2019   TSH 0.76 03/27/2016    Therapeutic Level Labs: No results found for: "LITHIUM" No results found for: "VALPROATE" No results found for: "CBMZ"  Current Medications: Current Outpatient Medications  Medication Sig Dispense Refill   buPROPion (WELLBUTRIN XL) 150 MG  24 hr tablet Take one tablet every morning for 7 days, then increase to two tablets every morning. 60 tablet 5   albuterol (PROVENTIL HFA;VENTOLIN HFA) 108 (90 BASE) MCG/ACT inhaler Inhale 2 puffs into the lungs every 4 (four) hours as needed for wheezing or shortness of breath.      Ascorbic Acid (VITAMIN C) 500 MG CAPS See admin instructions.  aspirin 81 MG tablet Take 81 mg by mouth daily.     Cholecalciferol ([employer] D3) 50 MCG (2000 UT) CAPS Take 1 capsule by mouth daily.     clindamycin (CLEOCIN-T) 1 % lotion Apply topically daily. 60 mL 8   Clindamycin-Benzoyl Per, Refr, gel Reported on 11/17/2015  6   clonazePAM (KLONOPIN) 1 MG tablet Take 1 mg by mouth daily.     desonide (DESOWEN) 0.05 % cream Apply topically daily. 60 g 8   fluticasone (FLONASE) 50 MCG/ACT nasal spray 1 2 SPRAY IN EACH NOSTRIL TWICE A DAY NASALLY 30 DAYS     ipratropium (ATROVENT) 0.06 % nasal spray Place 2 sprays into both nostrils 2 (two) times daily.     levocetirizine (XYZAL) 5 MG tablet Take 1 tablet (5 mg total) by mouth at bedtime. 90 tablet 4   QVAR 80 MCG/ACT inhaler Inhale 2 puffs into the lungs as needed.   5   rosuvastatin (CRESTOR) 10 MG tablet Take 5 mg by mouth daily.     sertraline (ZOLOFT) 50 MG tablet take 1/2 daily  1   Spacer/Aero-Holding Chambers [facility] DIAMOND) MISC as directed.     tretinoin (RETIN-A) 0.05 % cream Apply topically at bedtime. 45 g 9   triamcinolone cream (KENALOG) 0.1 % Apply 1 application. topically as needed. Reported on 11/17/2015 80 g 8   valACYclovir (VALTREX) 500 MG tablet Take one tablet po bid x 3 days prn 30 tablet 2   valsartan (DIOVAN) 160 MG tablet Take 80 mg by mouth daily.     VITAMIN D PO Take 2,000 Int'l Units by mouth daily.     No current facility-administered medications for this visit.    Medication Side Effects: none  Orders placed this visit:  No orders of the defined types were placed in this encounter.   Psychiatric Specialty Exam:  Review of  Systems  Musculoskeletal:  Negative for gait problem.  Neurological:  Negative for tremors.  Psychiatric/Behavioral:         Please refer to HPI    Last menstrual period 03/17/2013.There is no height or weight on file to calculate BMI.  General Appearance: Casual and Neat  Eye Contact:  Good  Speech:  Clear and Coherent and Normal Rate  Volume:  Normal  Mood:  Depressed  Affect:  Appropriate and Congruent  Thought Process:  Coherent and Descriptions of Associations: Intact  Orientation:  Full (Time, Place, and Person)  Thought Content: Logical   Suicidal Thoughts:  No  Homicidal Thoughts:  No  Memory:  WNL  Judgement:  Good  Insight:  Good  Psychomotor Activity:  Normal  Concentration:  Concentration: Good and Attention Span: Good  Recall:  Good  Fund of Knowledge: Good  Language: Good  Assets:  Communication Skills Desire for Improvement Financial Resources/Insurance Housing Intimacy Leisure Time Physical Health Resilience Social Support Talents/Skills Transportation Vocational/Educational  ADL's:  Intact  Cognition: WNL  Prognosis:  Good   Screenings:  PHQ2-9    [place] Office Visit from 09/12/2015 in [place] Office Visit from 08/29/2015 in [place]  PHQ-2 Total Score 0 0       Receiving Psychotherapy: No   Treatment Plan/Recommendations:   Plan:  PDMP reviewed  Zoloft '50mg'$  daily Clonazepam '1mg'$  - taking 1/2 tablet at bedtime.  Add Wellbutrin XL '150mg'$  every morning x 7 days, then increase to '300mg'$  daily. Denies seizure history.   Time spent with patient was 60 minutes. Greater  than 50% of face to face time with patient was spent on counseling and coordination of care.    RTC 4 weeks  Patient advised to contact office with any questions, adverse effects, or acute worsening in signs and symptoms.      Aloha Gell, NP

## 2022-09-05 NOTE — Progress Notes (Signed)
61 y.o. [id number] Married White or Caucasian Not Hispanic or Latino female here for annual exam.     Two years ago  she had an ultrasound for postcoital bleeding. U/S was normal. Bleeding was felt to be from atrophy, she was treated with vaginal estrogen. She used the vaginal estrogen for 6 months, didn't bleed during that time. She ended up stopping it, didn't want the medication. They have sex 1-2 x a month. Uses a lubricant, off of the estrogen she bleeds ~1/2 the time, feels like she tears. Tolerable. No other vaginal bleeding.  H/O genital hsv, takes prn medication.  Having ~3 outbreaks a year.   No bowel or bladder issues.   She reports a 20 lb weight loss this year.    Patient's last menstrual period was 03/17/2013 (exact date).          Sexually active: Yes.    The current method of family planning is post menopausal status.    Exercising: Yes.    Gym/ health club routine includes cardio and light weights. Smoker:  no  Health Maintenance: Pap:  09/06/21 WNL, neg HPV; 08/19/19 negative; 07-23-18 neg HPV HR neg History of abnormal Pap:  no MMG:  09/20/21 Bi-rads 1 neg  BMD:   03/29/22 normal  Colonoscopy: 2015 f/u 10 years  TDaP:  2017 Gardasil: n/a   reports that she has never smoked. She has never used smokeless tobacco. She reports current alcohol use of about 3.0 standard drinks of alcohol per week. She reports that she does not use drugs.  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 She is thinking about cutting back. Married x 34 years. Daughter is 44, just got engaged. Her son is 17 and has 2 children.    Past Medical History:  Diagnosis Date   Allergic rhinitis    Anxiety    Asthma    Depression    2005   Dyslipidemia    Edema in pregnancy    Exercise induced bronchospasm    Family history of breast cancer    maternal second cousin   Family history of early CAD 07/04/2017   Family history of lung cancer    Family  history of prostate cancer    Family history of thyroid cancer    HSV-2 (herpes simplex virus 2) infection    Hypertension    Hypophysitis ([place])    H/O Autoimmune   Mitral regurgitation    Palpitations    Panic attack    Perennial allergic rhinitis    PIH (pregnancy induced hypertension)    P partum// severe    Tonsillar calculus 05/2012   Dr Orpah Greek bates   Torn meniscus     Past Surgical History:  Procedure Laterality Date   ABLATION  2007   HTA   CESAREAN SECTION     x 2// Due to HSV2   FOOT SURGERY     KNEE SURGERY Left 2019   plastic eye surgery      Current Outpatient Medications  Medication Sig Dispense Refill   albuterol (PROVENTIL HFA;VENTOLIN HFA) 108 (90 BASE) MCG/ACT inhaler Inhale 2 puffs into the lungs every 4 (four) hours as needed for wheezing or shortness of breath.      Ascorbic Acid (VITAMIN C) 500 MG CAPS See admin instructions.     aspirin 81 MG tablet Take 81 mg by mouth daily.     buPROPion (WELLBUTRIN XL) 150 MG 24 hr tablet Take one  tablet every morning for 7 days, then increase to two tablets every morning. 60 tablet 5   Cholecalciferol (CVS D3) 50 MCG (2000 UT) CAPS Take 1 capsule by mouth daily.     clindamycin (CLEOCIN-T) 1 % lotion Apply topically daily. 60 mL 8   Clindamycin-Benzoyl Per, Refr, gel Reported on 11/17/2015  6   clonazePAM (KLONOPIN) 1 MG tablet Take 1 mg by mouth daily.     desonide (DESOWEN) 0.05 % cream Apply topically daily. 60 g 8   fluticasone (FLONASE) 50 MCG/ACT nasal spray 1 2 SPRAY IN EACH NOSTRIL TWICE A DAY NASALLY 30 DAYS     QVAR 80 MCG/ACT inhaler Inhale 2 puffs into the lungs as needed.   5   rosuvastatin (CRESTOR) 10 MG tablet Take 5 mg by mouth daily.     Semaglutide-Weight Management 1 MG/0.5ML SOAJ Inject into the skin.     sertraline (ZOLOFT) 50 MG tablet take 1/2 daily  1   Spacer/Aero-Holding [facility] DIAMOND) MISC as directed.     tretinoin (RETIN-A) 0.05 % cream Apply topically at bedtime. 45 g  9   triamcinolone cream (KENALOG) 0.1 % Apply 1 application. topically as needed. Reported on 11/17/2015 80 g 8   valACYclovir (VALTREX) 500 MG tablet Take one tablet po bid x 3 days prn 30 tablet 2   valsartan (DIOVAN) 160 MG tablet Take 80 mg by mouth daily.     VITAMIN D PO Take 2,000 Int'l Units by mouth daily.     levocetirizine (XYZAL) 5 MG tablet Take 1 tablet (5 mg total) by mouth at bedtime. (Patient not taking: Reported on 09/12/2022) 90 tablet 4   No current facility-administered medications for this visit.    Family History  Problem Relation Age of Onset   Prostate cancer Father 36       metastatic   Heart disease Father    Hyperlipidemia Brother    Thyroid cancer Brother 68   Heart disease Mother    Heart disease Maternal Grandfather    Cancer Paternal Grandfather        unknown type   Heart disease Brother    Hyperlipidemia Brother    Heart disease Brother    Hyperlipidemia Brother    Hyperlipidemia Brother    Hyperlipidemia Brother    Alzheimer's disease Maternal Grandmother    Thyroid cancer Paternal Grandmother        dx 44s   Lung cancer Paternal Aunt        smoker   Cancer Other        unknown type, dx late 35s, maternal great-aunt   Cancer Maternal Uncle        unknown type, dx 61s   Cancer Paternal Uncle        unknown type, dx >50   Cancer Paternal Uncle        unknown type, dx >50   Breast cancer Other 20       maternal second cousin    Review of Systems  All other systems reviewed and are negative.   Exam:   BP 118/68   Pulse 77   LMP 03/17/2013 (Exact Date)   SpO2 100%   Weight change: '@WEIGHTCHANGE'$ @ Height:      Ht Readings from Last 3 Encounters:  02/20/22 '5\' 7"'$  (1.702 m)  09/06/21 '5\' 7"'$  (1.702 m)  09/21/20 '5\' 6"'$  (1.676 m)    General appearance: alert, cooperative and appears stated age Head: Normocephalic, without obvious abnormality, atraumatic Neck: no adenopathy,  supple, symmetrical, trachea midline and thyroid normal to  inspection and palpation Lungs: clear to auscultation bilaterally Cardiovascular: regular rate and rhythm Breasts: normal appearance, no masses or tenderness Abdomen: soft, non-tender; non distended,  no masses,  no organomegaly Extremities: extremities normal, atraumatic, no cyanosis or edema Skin: Skin color, texture, turgor normal. No rashes or lesions Lymph nodes: Cervical, supraclavicular, and axillary nodes normal. No abnormal inguinal nodes palpated Neurologic: Grossly normal   Pelvic: External genitalia:  no lesions              Urethra:  normal appearing urethra with no masses, tenderness or lesions              Bartholins and Skenes: normal                 Vagina: normal appearing vagina with normal color and discharge, no lesions              Cervix: no lesions               Bimanual Exam:  Uterus:  normal size, contour, position, consistency, mobility, non-tender              Adnexa: no mass, fullness, tenderness               Rectovaginal: Confirms               Anus:  normal sphincter tone, no lesions  Gae Dry, CMA chaperoned for the exam.  1. Well woman exam No pap this year Discussed breast self exam Discussed calcium and vit D intake Mammogram scheduled Colonoscopy is UTD

## 2022-09-12 ENCOUNTER — Encounter: Payer: Self-pay | Admitting: Obstetrics and Gynecology

## 2022-09-12 ENCOUNTER — Ambulatory Visit (INDEPENDENT_AMBULATORY_CARE_PROVIDER_SITE_OTHER): Payer: 59 | Admitting: Obstetrics and Gynecology

## 2022-09-12 VITALS — BP 118/68 | HR 77

## 2022-09-12 DIAGNOSIS — Z01419 Encounter for gynecological examination (general) (routine) without abnormal findings: Secondary | ICD-10-CM

## 2022-09-12 NOTE — Patient Instructions (Signed)

## 2022-09-19 ENCOUNTER — Encounter: Payer: Self-pay | Admitting: Obstetrics and Gynecology

## 2022-09-20 ENCOUNTER — Other Ambulatory Visit: Payer: Self-pay | Admitting: Adult Health

## 2022-09-20 MED ORDER — BUPROPION HCL ER (XL) 300 MG PO TB24: 300.0000 mg | ORAL_TABLET | Freq: Every day | ORAL | 0 refills | Status: DC

## 2022-09-26 ENCOUNTER — Ambulatory Visit (INDEPENDENT_AMBULATORY_CARE_PROVIDER_SITE_OTHER): Payer: 59 | Admitting: Adult Health

## 2022-09-26 ENCOUNTER — Encounter: Payer: Self-pay | Admitting: Adult Health

## 2022-09-26 DIAGNOSIS — F331 Major depressive disorder, recurrent, moderate: Secondary | ICD-10-CM

## 2022-09-26 NOTE — Progress Notes (Signed)
Vickie Perry [id number] 1961/04/12 61 y.o.  Subjective:   Patient ID:  Vickie Perry is a 61 y.o. (DOB 11/07/1960) female.  Chief Complaint: No chief complaint on file.   HPI Vickie Perry presents to the office today for follow-up of MDD.  Describes mood today as "better". Pleasant. Denies tearfulness. Mood symptoms - reports decreased depression, anxiety and irritability. Reports some worry, rumination, and over thinking. 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 Stating "I'm trying to figure out what to do next". Mood has improved with addition of Wellbutrin - getting out of bed - less despair. Stating "I'm feeling better". Improved interest and motivation. Taking medications as prescribed. Energy levels stable. Active, has a regular exercise routine.  Enjoys some usual interests and activities. Married x 34 years. Has 2 children. Spending time with family. Appetite adequate. Weight stable. Sleeps better some nights than others. Averages 6 to 8 hours with Melatonin '10mg'$  at hs. Focus and concentration stable. Completing tasks. Managing aspects of household. 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  Denies SI or HI.  Denies AH or VH. Denies self harm.  Denies substance use.   [place] Office Visit from 09/12/2015 in [place] Office Visit from 08/29/2015 in [place]  PHQ-2 Total Score 0 0        Review of Systems:  Review of Systems  Musculoskeletal:  Negative for gait problem.  Neurological:  Negative for tremors.  Psychiatric/Behavioral:         Please refer to HPI    Medications: I have reviewed the patient's current medications.  Current Outpatient Medications  Medication Sig Dispense Refill   albuterol (PROVENTIL HFA;VENTOLIN HFA) 108 (90 BASE) MCG/ACT inhaler Inhale 2 puffs into the lungs every 4 (four) hours as needed for wheezing or shortness of breath.      Ascorbic Acid  (VITAMIN C) 500 MG CAPS See admin instructions.     aspirin 81 MG tablet Take 81 mg by mouth daily.     buPROPion (WELLBUTRIN XL) 150 MG 24 hr tablet Take one tablet every morning for 7 days, then increase to two tablets every morning. 60 tablet 5   Cholecalciferol ([employer] D3) 50 MCG (2000 UT) CAPS Take 1 capsule by mouth daily.     clindamycin (CLEOCIN-T) 1 % lotion Apply topically daily. 60 mL 8   Clindamycin-Benzoyl Per, Refr, gel Reported on 11/17/2015  6   clonazePAM (KLONOPIN) 1 MG tablet Take 1 mg by mouth daily.     desonide (DESOWEN) 0.05 % cream Apply topically daily. 60 g 8   fluticasone (FLONASE) 50 MCG/ACT nasal spray 1 2 SPRAY IN EACH NOSTRIL TWICE A DAY NASALLY 30 DAYS     levocetirizine (XYZAL) 5 MG tablet Take 1 tablet (5 mg total) by mouth at bedtime. (Patient not taking: Reported on 09/12/2022) 90 tablet 4   QVAR 80 MCG/ACT inhaler Inhale 2 puffs into the lungs as needed.   5   rosuvastatin (CRESTOR) 10 MG tablet Take 5 mg by mouth daily.     Semaglutide-Weight Management 1 MG/0.5ML SOAJ Inject into the skin.     sertraline (ZOLOFT) 50 MG tablet take 1/2 daily  1   Spacer/Aero-Holding Chambers [facility] DIAMOND) MISC as directed.     tretinoin (RETIN-A) 0.05 % cream Apply topically at bedtime. 45 g 9   triamcinolone cream (KENALOG) 0.1 % Apply 1 application. topically as needed. Reported on 11/17/2015 80 g  8   valACYclovir (VALTREX) 500 MG tablet Take one tablet po bid x 3 days prn 30 tablet 2   valsartan (DIOVAN) 160 MG tablet Take 80 mg by mouth daily.     VITAMIN D PO Take 2,000 Int'l Units by mouth daily.     No current facility-administered medications for this visit.    Medication Side Effects: None  Allergies:  Allergies  Allergen Reactions   Erythromycin Anaphylaxis and Other (See Comments)    Past Medical History:  Diagnosis Date   Allergic rhinitis    Anxiety    Asthma    Depression    2005   Dyslipidemia    Edema in pregnancy    Exercise induced  bronchospasm    Family history of breast cancer    maternal second cousin   Family history of early CAD 07/04/2017   Family history of lung cancer    Family history of prostate cancer    Family history of thyroid cancer    HSV-2 (herpes simplex virus 2) infection    Hypertension    Hypophysitis ([place])    H/O Autoimmune   Mitral regurgitation    Palpitations    Panic attack    Perennial allergic rhinitis    PIH (pregnancy induced hypertension)    P partum// severe    Tonsillar calculus 05/2012   Dr Orpah Greek bates   Torn meniscus     Past Medical History, Surgical history, Social history, and Family history were reviewed and updated as appropriate.   Please see review of systems for further details on the patient's review from today.   Objective:   Physical Exam:  LMP 03/17/2013 (Exact Date)   Physical Exam Constitutional:      General: She is not in acute distress. Musculoskeletal:        General: No deformity.  Neurological:     Mental Status: She is alert and oriented to person, place, and time.     Coordination: Coordination normal.  Psychiatric:        Attention and Perception: Attention and perception normal. She does not perceive auditory or visual hallucinations.        Mood and Affect: Mood normal. Mood is not anxious or depressed. Affect is not labile, blunt, angry or inappropriate.        Speech: Speech normal.        Behavior: Behavior normal.        Thought Content: Thought content normal. Thought content is not paranoid or delusional. Thought content does not include homicidal or suicidal ideation. Thought content does not include homicidal or suicidal plan.        Cognition and Memory: Cognition and memory normal.        Judgment: Judgment normal.     Comments: Insight intact     Lab Review:     Component Value Date/Time   NA 139 08/19/2019 1045   K 4.3 08/19/2019 1045   CL 104 08/19/2019 1045   CO2 24 08/19/2019 1045   GLUCOSE 102 (H) 08/19/2019 1045    BUN 13 08/19/2019 1045   CREATININE 0.90 08/19/2019 1045   CREATININE 0.84 03/27/2016 1535   CALCIUM 9.6 08/19/2019 1045   PROT 7.8 08/19/2019 1045   ALBUMIN 4.3 08/19/2019 1045   AST 27 08/19/2019 1045   ALT 27 08/19/2019 1045   ALKPHOS 77 08/19/2019 1045   BILITOT 0.6 08/19/2019 1045   GFRNONAA >60 08/19/2019 1045   GFRAA >60 08/19/2019 1045  Component Value Date/Time   WBC 6.4 08/19/2019 1045   WBC 5.1 03/27/2016 1535   RBC 4.76 08/19/2019 1045   HGB 13.9 08/19/2019 1045   HGB 12.7 07/23/2018 1539   HGB 12.9 03/27/2016 1136   HCT 41.6 08/19/2019 1045   HCT 37.6 07/23/2018 1539   PLT 303 08/19/2019 1045   PLT 243 07/23/2018 1539   MCV 87.4 08/19/2019 1045   MCV 85 07/23/2018 1539   MCH 29.2 08/19/2019 1045   MCHC 33.4 08/19/2019 1045   RDW 12.0 08/19/2019 1045   RDW 13.6 07/23/2018 1539   LYMPHSABS 2.6 08/19/2019 1045   LYMPHSABS 4.8 (H) 07/23/2018 1539   MONOABS 0.4 08/19/2019 1045   EOSABS 0.2 08/19/2019 1045   EOSABS 0.2 07/23/2018 1539   BASOSABS 0.0 08/19/2019 1045   BASOSABS 0.1 07/23/2018 1539    No results found for: "POCLITH", "LITHIUM"   No results found for: "PHENYTOIN", "PHENOBARB", "VALPROATE", "CBMZ"   .res Assessment: Plan:    Treatment Plan/Recommendations:   Plan:  PDMP reviewed  Zoloft '50mg'$  daily Clonazepam '1mg'$  - taking 1/2 tablet at bedtime. Wellbutrin XL '150mg'$  take two tablets every morning. Denies seizure history.   Time spent with patient was 60 minutes. Greater than 50% of face to face time with patient was spent on counseling and coordination of care.    RTC 6 months  Patient advised to contact office with any questions, adverse effects, or acute worsening in signs and symptoms.   Diagnoses and all orders for this visit:  Major depressive disorder, recurrent episode, moderate ([place])     Please see After Visit Summary for patient specific instructions.  Future Appointments  Date Time Provider [place]   09/19/2023  1:30 PM Salvadore Dom, MD [facility] None    No orders of the defined types were placed in this encounter.   -------------------------------

## 2022-10-17 ENCOUNTER — Other Ambulatory Visit: Payer: Self-pay | Admitting: Adult Health

## 2023-01-01 ENCOUNTER — Encounter: Payer: Self-pay | Admitting: Cardiology

## 2023-01-01 ENCOUNTER — Ambulatory Visit: Payer: 59 | Attending: Cardiology | Admitting: Cardiology

## 2023-01-01 VITALS — BP 110/70 | HR 70 | Ht 67.0 in | Wt 169.0 lb

## 2023-01-01 DIAGNOSIS — R002 Palpitations: Secondary | ICD-10-CM

## 2023-01-01 DIAGNOSIS — Z01818 Encounter for other preprocedural examination: Secondary | ICD-10-CM

## 2023-01-01 DIAGNOSIS — I340 Nonrheumatic mitral (valve) insufficiency: Secondary | ICD-10-CM

## 2023-01-01 DIAGNOSIS — Z8249 Family history of ischemic heart disease and other diseases of the circulatory system: Secondary | ICD-10-CM

## 2023-01-01 DIAGNOSIS — I10 Essential (primary) hypertension: Secondary | ICD-10-CM

## 2023-01-01 NOTE — Addendum Note (Signed)
Addended by: Vergia Alcon A on: 01/01/2023 01:41 PM   Modules accepted: Orders

## 2023-01-01 NOTE — Progress Notes (Signed)
Cardiology Office Note:    Date:  01/01/2023   ID:  Vickie Perry, DOB 11-Apr-1961, MRN [id number]  PCP:  Charlane Ferretti, MD  Cardiologist:  Fransico Him, MD    Referring MD: Charlane Ferretti, MD   Chief Complaint  Patient presents with   Mitral Regurgitation   Hypertension   Palpitations    History of Present Illness:    Vickie Perry is a 62 y.o. female with a hx of mild MR/AR/PR, HTN, family history of CAD with coronary calcium score of 0 and history of palpitations with no significant arrhythmias on heart monitor.   She is here today for followup and is doing well.  She denies any chest pain or pressure, SOB, DOE, PND, orthopnea, LE edema, dizziness, palpitations or syncope. She is compliant with her meds and is tolerating meds with no SE.  She is exercising with no problems.  Past Medical History:  Diagnosis Date   Allergic rhinitis    Anxiety    Asthma    Depression    2005   Dyslipidemia    Edema in pregnancy    Exercise induced bronchospasm    Family history of breast cancer    maternal second cousin   Family history of early CAD 07/04/2017   Family history of lung cancer    Family history of prostate cancer    Family history of thyroid cancer    HSV-2 (herpes simplex virus 2) infection    Hypertension    Hypophysitis ([place])    H/O Autoimmune   Mitral regurgitation    Palpitations    Panic attack    Perennial allergic rhinitis    PIH (pregnancy induced hypertension)    P partum// severe    Tonsillar calculus 05/2012   Dr Orpah Greek bates   Torn meniscus     Past Surgical History:  Procedure Laterality Date   ABLATION  2007   HTA   CESAREAN SECTION     x 2// Due to HSV2   FOOT SURGERY     KNEE SURGERY Left 2019   plastic eye surgery      Current Medications: Current Meds  Medication Sig   albuterol (PROVENTIL HFA;VENTOLIN HFA) 108 (90 BASE) MCG/ACT inhaler Inhale 2 puffs into the lungs every 4 (four) hours as needed for wheezing or  shortness of breath.    Ascorbic Acid (VITAMIN C) 500 MG CAPS See admin instructions.   aspirin 81 MG tablet Take 81 mg by mouth daily.   buPROPion (WELLBUTRIN XL) 150 MG 24 hr tablet Take one tablet every morning for 7 days, then increase to two tablets every morning.   Cholecalciferol (CVS D3) 50 MCG (2000 UT) CAPS Take 1 capsule by mouth daily.   clindamycin (CLEOCIN-T) 1 % lotion Apply topically daily.   Clindamycin-Benzoyl Per, Refr, gel Reported on 11/17/2015   clonazePAM (KLONOPIN) 1 MG tablet Take 1 mg by mouth daily.   levocetirizine (XYZAL) 5 MG tablet Take 1 tablet (5 mg total) by mouth at bedtime. (Patient taking differently: Take 5 mg by mouth as needed.)   QVAR 80 MCG/ACT inhaler Inhale 2 puffs into the lungs as needed.    rosuvastatin (CRESTOR) 10 MG tablet Take 5 mg by mouth daily.   Semaglutide-Weight Management 1 MG/0.5ML SOAJ Inject into the skin.   sertraline (ZOLOFT) 50 MG tablet take 1/2 daily   Spacer/Aero-Holding Chambers [facility] DIAMOND) MISC as directed.   tretinoin (RETIN-A) 0.05 % cream Apply topically at bedtime.  triamcinolone cream (KENALOG) 0.1 % Apply 1 application. topically as needed. Reported on 11/17/2015   valACYclovir (VALTREX) 500 MG tablet Take one tablet po bid x 3 days prn   valsartan (DIOVAN) 160 MG tablet Take 80 mg by mouth daily.   [DISCONTINUED] desonide (DESOWEN) 0.05 % cream Apply topically daily.   [DISCONTINUED] fluticasone (FLONASE) 50 MCG/ACT nasal spray 1 2 SPRAY IN EACH NOSTRIL TWICE A DAY NASALLY 30 DAYS   [DISCONTINUED] VITAMIN D PO Take 2,000 Int'l Units by mouth daily.     Allergies:   Erythromycin   Social History   Socioeconomic History   Marital status: Married    Spouse name: Not on file   Number of children: Not on file   Years of education: Not on file   Highest education level: Not on file  Occupational History   Not on file  Tobacco Use   Smoking status: Never   Smokeless tobacco: Never  Vaping Use   Vaping  Use: Never used  Substance and Sexual Activity   Alcohol use: Yes    Alcohol/week: 3.0 standard drinks of alcohol    Types: 3 Standard drinks or equivalent per week   Drug use: No   Sexual activity: Yes    Partners: Male    Birth control/protection: Other-see comments, Post-menopausal    Comment: vasectomy  Other Topics Concern   Not on file  Social History Narrative   Not on file   Social Determinants of Health   Financial Resource Strain: Not on file  Food Insecurity: Not on file  Transportation Needs: Not on file  Physical Activity: Not on file  Stress: Not on file  Social Connections: Not on file     Family History: The patient's family history includes Alzheimer's disease in her maternal grandmother; Breast cancer (age of onset: 57) in an other family member; Cancer in her maternal uncle, paternal grandfather, paternal uncle, paternal uncle, and another family member; Heart disease in her brother, brother, father, maternal grandfather, and mother; Hyperlipidemia in her brother, brother, brother, brother, and brother; Lung cancer in her paternal aunt; Prostate cancer (age of onset: 71) in her father; Thyroid cancer in her paternal grandmother; Thyroid cancer (age of onset: 15) in her brother.  ROS:   Please see the history of present illness.    ROS  All other systems reviewed and negative.   EKGs/Labs/Other Studies Reviewed:    The following studies were reviewed today: none  EKG:  EKG is  ordered today.  The ekg ordered today demonstrates NSR with no ST changes  Recent Labs: No results found for requested labs within last 365 days.   Recent Lipid Panel    Component Value Date/Time   CHOL 172 03/27/2016 1535   TRIG 164 (H) 03/27/2016 1535   HDL 58 03/27/2016 1535   CHOLHDL 3.0 03/27/2016 1535   VLDL 33 (H) 03/27/2016 1535   LDLCALC 81 03/27/2016 1535    Physical Exam:    VS:  BP 110/70   Pulse 70   Ht '5\' 7"'$  (1.702 m)   Wt 169 lb (76.7 kg) Comment: Per  patient. Refused to weigh  LMP 03/17/2013 (Exact Date)   SpO2 98%   BMI 26.47 kg/m     Wt Readings from Last 3 Encounters:  01/01/23 169 lb (76.7 kg)  02/20/22 190 lb (86.2 kg)  09/24/18 200 lb 11.2 oz (91 kg)     GEN: Well nourished, well developed in no acute distress HEENT: Normal NECK: No  JVD; No carotid bruits LYMPHATICS: No lymphadenopathy CARDIAC:RRR, no murmurs, rubs, gallops RESPIRATORY:  Clear to auscultation without rales, wheezing or rhonchi  ABDOMEN: Soft, non-tender, non-distended MUSCULOSKELETAL:  No edema; No deformity  SKIN: Warm and dry NEUROLOGIC:  Alert and oriented x 3 PSYCHIATRIC:  Normal affect  ASSESSMENT:    1. Nonrheumatic mitral valve regurgitation   2. Benign essential HTN   3. Palpitations   4. Family history of early CAD   40. Preoperative clearance    PLAN:    In order of problems listed above:  1. Mitral regurgitation -trivial on echo 2018  2.  HTN -BP is adequate on exam -Continue prescription drug management with valsartan 80 mg daily with as needed refills -check BMET  3.  Palpitations -She denies any significant palpitations -She has not used any of as needed beta-blocker  4.  Family hx of premature CAD -coronary Ca score was 0 in 2018 -will repeat not that it has been 5 years  5.  Preoperative cardiovascular examination The patient's perioperative risk of a major cardiac event is 4% according to the Revised Cardiac Risk Index (RCRI).  Therefore, the patent is at low risk for perioperative complications. The patient's functional capacity is good at 8.97 METs according to the Duke Activity Status Index (DASI).  Medication Adjustments/Labs and Tests Ordered: Current medicines are reviewed at length with the patient today.  Concerns regarding medicines are outlined above.  Orders Placed This Encounter  Procedures   EKG 12-Lead   No orders of the defined types were placed in this encounter.   Signed, Fransico Him, MD   01/01/2023 1:19 PM    Center

## 2023-01-01 NOTE — Patient Instructions (Signed)
Medication Instructions:  Your physician recommends that you continue on your current medications as directed. Please refer to the Current Medication list given to you today.  *If you need a refill on your cardiac medications before your next appointment, please call your pharmacy*   Lab Work: BMET If you have labs (blood work) drawn today and your tests are completely normal, you will receive your results only by: [place] (if you have MyChart) OR A paper copy in the mail If you have any lab test that is abnormal or we need to change your treatment, we will call you to review the results.   Testing/Procedures: Coronary calcium score    Follow-Up: At [facility], you and your health needs are our priority.  As part of our continuing mission to provide you with exceptional heart care, we have created designated Provider Care Teams.  These Care Teams include your primary Cardiologist (physician) and Advanced Practice Providers (APPs -  Physician Assistants and Nurse Practitioners) who all work together to provide you with the care you need, when you need it.  We recommend signing up for the patient portal called "MyChart".  Sign up information is provided on this After Visit Summary.  MyChart is used to connect with patients for Virtual Visits (Telemedicine).  Patients are able to view lab/test results, encounter notes, upcoming appointments, etc.  Non-urgent messages can be sent to your provider as well.   To learn more about what you can do with MyChart, go to [email or web address].    Your next appointment:   1 year(s)  Provider:   Fransico Him, MD

## 2023-01-02 ENCOUNTER — Ambulatory Visit: Payer: 59 | Admitting: Cardiology

## 2023-01-02 LAB — BASIC METABOLIC PANEL
BUN/Creatinine Ratio: 13 (ref 9–23)
BUN: 13 mg/dL (ref 6–24)
CO2: 20 mmol/L (ref 20–29)
Calcium: 9.7 mg/dL (ref 8.7–10.2)
Chloride: 102 mmol/L (ref 96–106)
Creatinine, Ser: 1 mg/dL (ref 0.57–1.00)
Glucose: 87 mg/dL (ref 70–99)
Potassium: 4.4 mmol/L (ref 3.5–5.2)
Sodium: 138 mmol/L (ref 134–144)
eGFR: 65 mL/min/{1.73_m2} (ref >59)

## 2023-01-04 ENCOUNTER — Telehealth: Payer: Self-pay

## 2023-01-04 NOTE — Telephone Encounter (Signed)
-----   Message from Nuala Alpha, LPN sent at [id number]  8:17 AM EST -----  ----- Message ----- From: Freada Bergeron, MD Sent: 01/02/2023   6:47 PM EST To: Rebeca Alert [facility] Triage  Kidney function looks great. Electrolytes are normal.

## 2023-01-04 NOTE — Telephone Encounter (Signed)
Normal results reviewed with patient who verbalizes understanding.

## 2023-01-17 ENCOUNTER — Ambulatory Visit (HOSPITAL_COMMUNITY)
Admission: RE | Admit: 2023-01-17 | Discharge: 2023-01-17 | Disposition: A | Discharge status: Home / Self Care | Payer: 59 | Source: Ambulatory Visit | Attending: Cardiology | Admitting: Cardiology

## 2023-01-17 DIAGNOSIS — I340 Nonrheumatic mitral (valve) insufficiency: Secondary | ICD-10-CM | POA: Insufficient documentation

## 2023-01-17 DIAGNOSIS — Z8249 Family history of ischemic heart disease and other diseases of the circulatory system: Secondary | ICD-10-CM | POA: Insufficient documentation

## 2023-01-17 DIAGNOSIS — R002 Palpitations: Secondary | ICD-10-CM | POA: Insufficient documentation

## 2023-01-17 DIAGNOSIS — Z01818 Encounter for other preprocedural examination: Secondary | ICD-10-CM | POA: Insufficient documentation

## 2023-01-17 DIAGNOSIS — I10 Essential (primary) hypertension: Secondary | ICD-10-CM | POA: Insufficient documentation

## 2023-01-18 ENCOUNTER — Telehealth: Payer: Self-pay

## 2023-01-18 NOTE — Telephone Encounter (Signed)
Patient verbalizes understanding low risk results from this test.

## 2023-01-18 NOTE — Telephone Encounter (Signed)
-----   Message from Sueanne Margarita, MD sent at 01/18/2023  7:58 AM EDT ----- Doristine Devoid news low risk coronary calcium score with coronary calcium score of 0 which means very low risk for a cardiac event in the next 5 years

## 2023-01-29 ENCOUNTER — Ambulatory Visit: Payer: 59 | Admitting: Physician Assistant

## 2023-02-10 ENCOUNTER — Other Ambulatory Visit: Payer: Self-pay | Admitting: Adult Health

## 2023-03-05 ENCOUNTER — Other Ambulatory Visit: Payer: Self-pay | Admitting: Adult Health

## 2023-03-06 NOTE — Telephone Encounter (Signed)
Please call to schedule an appt, due this month.  

## 2023-04-08 ENCOUNTER — Other Ambulatory Visit: Payer: Self-pay | Admitting: Adult Health

## 2023-04-08 NOTE — Telephone Encounter (Signed)
Sent My-Chart message

## 2023-04-08 NOTE — Telephone Encounter (Signed)
Pt is no longer taking the medication and is doing fine

## 2023-05-17 ENCOUNTER — Other Ambulatory Visit: Payer: Self-pay | Admitting: Internal Medicine

## 2023-05-17 DIAGNOSIS — R1030 Lower abdominal pain, unspecified: Secondary | ICD-10-CM

## 2023-06-12 ENCOUNTER — Ambulatory Visit
Admission: RE | Admit: 2023-06-12 | Discharge: 2023-06-12 | Disposition: A | Discharge status: Home / Self Care | Payer: Managed Care, Other (non HMO) | Source: Ambulatory Visit | Attending: Internal Medicine | Admitting: Internal Medicine

## 2023-06-12 DIAGNOSIS — R1030 Lower abdominal pain, unspecified: Secondary | ICD-10-CM

## 2023-06-12 MED ORDER — IOPAMIDOL (ISOVUE-300) INJECTION 61%
200.0000 mL | Freq: Once | INTRAVENOUS | Status: AC | PRN
  Administered 2023-06-12: 100 mL via INTRAVENOUS

## 2023-09-19 ENCOUNTER — Ambulatory Visit: Payer: 59 | Admitting: Obstetrics and Gynecology

## 2023-09-26 ENCOUNTER — Ambulatory Visit: Payer: 59 | Admitting: Obstetrics and Gynecology

## 2023-09-27 ENCOUNTER — Encounter: Payer: Self-pay | Admitting: Obstetrics and Gynecology

## 2023-11-20 ENCOUNTER — Ambulatory Visit (INDEPENDENT_AMBULATORY_CARE_PROVIDER_SITE_OTHER): Payer: Managed Care, Other (non HMO) | Admitting: Obstetrics and Gynecology

## 2023-11-20 ENCOUNTER — Other Ambulatory Visit (HOSPITAL_COMMUNITY)
Admission: RE | Admit: 2023-11-20 | Discharge: 2023-11-20 | Disposition: A | Discharge status: Home / Self Care | Payer: Managed Care, Other (non HMO) | Source: Ambulatory Visit | Attending: Obstetrics and Gynecology | Admitting: Obstetrics and Gynecology

## 2023-11-20 ENCOUNTER — Encounter: Payer: Self-pay | Admitting: Obstetrics and Gynecology

## 2023-11-20 VITALS — BP 118/72 | HR 73 | Ht 66.0 in

## 2023-11-20 DIAGNOSIS — N952 Postmenopausal atrophic vaginitis: Secondary | ICD-10-CM

## 2023-11-20 DIAGNOSIS — Z01419 Encounter for gynecological examination (general) (routine) without abnormal findings: Secondary | ICD-10-CM | POA: Insufficient documentation

## 2023-11-20 MED ORDER — INTRAROSA 6.5 MG VA INST: 1.0000 | VAGINAL_INSERT | Freq: Every evening | VAGINAL | 12 refills | Status: DC | PRN

## 2023-11-20 NOTE — Progress Notes (Signed)
 63 y.o. y.o. female here for annual exam. Patient's last menstrual period was 03/17/2013 (exact date).    [id number] Married White or Caucasian Not Hispanic or Latino female here for annual exam.     She had an ultrasound for postcoital bleeding 2021. U/S was normal. Bleeding was felt to be from atrophy, she was treated with vaginal estrogen. She used the vaginal estrogen for 6 months, didn't bleed during that time. She ended up stopping it, didn't want the medication. They have sex 1-2 x a month. Uses a lubricant, off of the estrogen she bleeds ~1/2 the time, feels like she tears. Reports lack of desire and dyspareunia. No other vaginal bleeding.  H/O genital hsv, takes prn medication.  Having ~3 outbreaks a year.   No bowel or bladder issues.   She reports a 20 lb weight loss this year.    Patient's last menstrual period was 03/17/2013 (exact date).          Sexually active: Yes.    The current method of family planning is post menopausal status.    Exercising: Yes.    Gym/ health club routine includes cardio and light weights. Smoker:  no  Health Maintenance: Pap: 2022 WNL, neg HPV; 08/19/19 negative; 07-23-18 neg HPV HR neg History of abnormal Pap:  no MMG:  09/27/23 Bi-rads 1 neg  BMD:   03/29/22 normal T 0.7 repeat 5 years Colonoscopy: 2015 f/u 10 years - reports she will schedule TDaP:  2017 Gardasil: n/a There is no height or weight on file to calculate BMI.     09/12/2015    3:49 PM 08/29/2015   10:01 AM  Depression screen PHQ 2/9  Decreased Interest 0 0  Down, Depressed, Hopeless 0 0  PHQ - 2 Score 0 0    Last menstrual period 03/17/2013.     Component Value Date/Time   DIAGPAP  09/06/2021 1040    - Negative for intraepithelial lesion or malignancy (NILM)   DIAGPAP  08/26/2020 1031    - Negative for intraepithelial lesion or malignancy (NILM)   DIAGPAP  08/19/2019 1417    - Negative for intraepithelial lesion or malignancy (NILM)   HPVHIGH Negative  09/06/2021 1040   ADEQPAP  09/06/2021 1040    Satisfactory for evaluation; transformation zone component PRESENT.   ADEQPAP  08/26/2020 1031    Satisfactory for evaluation. The presence or absence of an   ADEQPAP  08/26/2020 1031    endocervical/transformation zone component cannot be determined because   ADEQPAP of atrophy. 08/26/2020 1031    GYN HISTORY:    Component Value Date/Time   DIAGPAP  09/06/2021 1040    - Negative for intraepithelial lesion or malignancy (NILM)   DIAGPAP  08/26/2020 1031    - Negative for intraepithelial lesion or malignancy (NILM)   DIAGPAP  08/19/2019 1417    - Negative for intraepithelial lesion or malignancy (NILM)   HPVHIGH Negative 09/06/2021 1040   ADEQPAP  09/06/2021 1040    Satisfactory for evaluation; transformation zone component PRESENT.   ADEQPAP  08/26/2020 1031    Satisfactory for evaluation. The presence or absence of an   ADEQPAP  08/26/2020 1031    endocervical/transformation zone component cannot be determined because   ADEQPAP of atrophy. 08/26/2020 1031    OB History  Gravida Para Term Preterm AB Living  6 2   4 2   SAB IAB Ectopic Multiple Live Births          # Outcome Date GA  Lbr Len/2nd Weight Sex Type Anes PTL Lv  6 AB           5 AB           4 AB           3 AB           2 Para           1 Para             Past Medical History:  Diagnosis Date   Allergic rhinitis    Anxiety    Asthma    Depression    2005   Dyslipidemia    Edema in pregnancy    Exercise induced bronchospasm    Family history of breast cancer    maternal second cousin   Family history of early CAD 07/04/2017   Family history of lung cancer    Family history of prostate cancer    Family history of thyroid  cancer    HSV-2 (herpes simplex virus 2) infection    Hypertension    Hypophysitis (HCC)    H/O Autoimmune   Mitral regurgitation    Palpitations    Panic attack    Perennial allergic rhinitis    PIH (pregnancy induced  hypertension)    P partum// severe    Tonsillar calculus 05/2012   Dr Arma [employer] bates   Torn meniscus     Past Surgical History:  Procedure Laterality Date   ABLATION  2007   HTA   CESAREAN SECTION     x 2// Due to HSV2   FOOT SURGERY     KNEE SURGERY Left 2019   plastic eye surgery      Current Outpatient Medications on File Prior to Visit  Medication Sig Dispense Refill   albuterol  (PROVENTIL  HFA;VENTOLIN  HFA) 108 (90 BASE) MCG/ACT inhaler Inhale 2 puffs into the lungs every 4 (four) hours as needed for wheezing or shortness of breath.      Ascorbic Acid (VITAMIN C) 500 MG CAPS See admin instructions.     aspirin 81 MG tablet Take 81 mg by mouth daily.     buPROPion  (WELLBUTRIN  XL) 150 MG 24 hr tablet Take two tablets every morning. 60 tablet 1   Cholecalciferol ([employer] D3) 50 MCG (2000 UT) CAPS Take 1 capsule by mouth daily.     Clindamycin -Benzoyl Per, Refr, gel Reported on 11/17/2015  6   clonazePAM (KLONOPIN) 1 MG tablet Take 1 mg by mouth daily.     levocetirizine (XYZAL ) 5 MG tablet Take 1 tablet (5 mg total) by mouth at bedtime. (Patient taking differently: Take 5 mg by mouth as needed.) 90 tablet 4   QVAR 80 MCG/ACT inhaler Inhale 2 puffs into the lungs as needed.   5   rosuvastatin (CRESTOR) 10 MG tablet Take 5 mg by mouth daily.     Semaglutide-Weight Management 1 MG/0.5ML SOAJ Inject into the skin.     sertraline (ZOLOFT) 50 MG tablet take 1/2 daily  1   Spacer/Aero-Holding Chambers Inov8 Surgical DIAMOND) MISC as directed.     tretinoin  (RETIN-A ) 0.05 % cream Apply topically at bedtime. 45 g 9   triamcinolone  cream (KENALOG ) 0.1 % Apply 1 application. topically as needed. Reported on 11/17/2015 80 g 8   valACYclovir  (VALTREX ) 500 MG tablet Take one tablet po bid x 3 days prn 30 tablet 2   valsartan (DIOVAN) 160 MG tablet Take 80 mg by mouth daily.     No current facility-administered medications  on file prior to visit.    Social History   Socioeconomic History   Marital  status: Married    Spouse name: Not on file   Number of children: Not on file   Years of education: Not on file   Highest education level: Not on file  Occupational History   Not on file  Tobacco Use   Smoking status: Never   Smokeless tobacco: Never  Vaping Use   Vaping status: Never Used  Substance and Sexual Activity   Alcohol use: Yes    Alcohol/week: 3.0 standard drinks of alcohol    Types: 3 Standard drinks or equivalent per week   Drug use: No   Sexual activity: Yes    Partners: Male    Birth control/protection: Other-see comments, Post-menopausal    Comment: vasectomy  Other Topics Concern   Not on file  Social History Narrative   Not on file   Social Drivers of Health   Financial Resource Strain: Not on file  Food Insecurity: Low Risk  (05/13/2023)   Received from [facility]   Hunger Vital Sign    Worried About Running Out of Food in the Last Year: Never true    Ran Out of Food in the Last Year: Never true  Transportation Needs: No Transportation Needs (03/21/2023)   [facility]   Transportation    In the past 12 months, has lack of reliable transportation kept you from medical appointments, meetings, work or from getting things needed for daily living? : No  Physical Activity: Not on file  Stress: Not on file  Social Connections: Not on file  Intimate Partner Violence: Not on file    Family History  Problem Relation Age of Onset   Prostate cancer Father 98       metastatic   Heart disease Father    Hyperlipidemia Brother    Thyroid  cancer Brother 44   Heart disease Mother    Heart disease Maternal Grandfather    Cancer Paternal Grandfather        unknown type   Heart disease Brother    Hyperlipidemia Brother    Heart disease Brother    Hyperlipidemia Brother    Hyperlipidemia Brother    Hyperlipidemia Brother    Alzheimer's disease Maternal Grandmother    Thyroid  cancer Paternal Grandmother        dx 12s   Lung  cancer Paternal Aunt        smoker   Cancer Other        unknown type, dx late 59s, maternal great-aunt   Cancer Maternal Uncle        unknown type, dx 35s   Cancer Paternal Uncle        unknown type, dx >50   Cancer Paternal Uncle        unknown type, dx >50   Breast cancer Other 69       maternal second cousin     Allergies  Allergen Reactions   Erythromycin Anaphylaxis and Other (See Comments)      Patient's last menstrual period was Patient's last menstrual period was 03/17/2013 (exact date)..            Review of Systems Alls systems reviewed and are negative.     Physical Exam Constitutional:      Appearance: Normal appearance.  Genitourinary:     Vulva and urethral meatus normal.     No lesions in the vagina.  Right Labia: No rash, lesions or skin changes.    Left Labia: No lesions, skin changes or rash.    No vaginal discharge or tenderness.     No vaginal prolapse present.    No vaginal atrophy present.     Right Adnexa: not tender, not palpable and no mass present.    Left Adnexa: not tender, not palpable and no mass present.    No cervical motion tenderness or discharge.     Uterus is not enlarged, tender or irregular.  Breasts:    Right: Normal.     Left: Normal.  HENT:     Head: Normocephalic.  Neck:     Thyroid : No thyroid  mass, thyromegaly or thyroid  tenderness.  Cardiovascular:     Rate and Rhythm: Normal rate and regular rhythm.     Heart sounds: Normal heart sounds, S1 normal and S2 normal.  Pulmonary:     Effort: Pulmonary effort is normal.     Breath sounds: Normal breath sounds and air entry.  Abdominal:     General: There is no distension.     Palpations: Abdomen is soft. There is no mass.     Tenderness: There is no abdominal tenderness. There is no guarding or rebound.  Musculoskeletal:        General: Normal range of motion.     Cervical back: Full passive range of motion without pain, normal range of motion and neck supple.  No tenderness.     Right lower leg: No edema.     Left lower leg: No edema.  Neurological:     Mental Status: She is alert.  Skin:    General: Skin is warm.  Psychiatric:        Mood and Affect: Mood normal.        Behavior: Behavior normal.        Thought Content: Thought content normal.  Vitals and nursing note reviewed. Exam conducted with a chaperone present.       A:         Well Woman GYN exam                             P:        Pap smear collected today Encouraged annual mammogram screening Colon cancer screening PMD placed referral DXA up-to-date Labs and immunizations to do with PMD Encouraged healthy lifestyle practices Encouraged Vit D and Calcium  Decreased libido and atrophic vaginitis not responsive to cream: Intrarosa  prescription placed. She will also read on Vyleesi.  No follow-ups on file.  Vickie Perry

## 2023-11-22 LAB — CYTOLOGY - PAP
Comment: NEGATIVE
Diagnosis: UNDETERMINED — AB
High risk HPV: NEGATIVE

## 2023-11-25 ENCOUNTER — Encounter: Payer: Self-pay | Admitting: Obstetrics and Gynecology

## 2023-12-02 ENCOUNTER — Telehealth: Payer: Self-pay | Admitting: Cardiology

## 2023-12-02 DIAGNOSIS — R002 Palpitations: Secondary | ICD-10-CM

## 2023-12-02 NOTE — Telephone Encounter (Signed)
Patient calling back, she explains that to her knowledge she does not have a history of afib, only PVC's. However she states she has had "palpitations for years" and at one time was prescribed metoprolol succinate to control them.  She states that over the last 2 weeks she she has had what feels like "an air bubble" in her left mid chest. She states this is a new sensation and unlike the palpitations she had chronically before. She says it lasts 1 second but she has it maybe 20 times a day. She denies any SOB or chest pain, she states they seem to occur more often at rest than on exertion and she has experienced no limitations to her mobility or her tolerance for activity or ADL's.   She states she does have an expired bottle of metoprolol succinate (25 mg) on hand that was prescribed by her PCP (who is now retired). She did try one dose to see if it helped and she reports that it did.   I offered appt w/ M. Swinyer NP on 12/05/23, patient accepted. I advised patient to get in contact with her current PCP to see if they recommend she continue taking metoprolol succinate. I also discussed ED precautions, patient verbalizes understanding to call 911 if chest palpitations escalate to chest pain and/or shortness of breath.

## 2023-12-02 NOTE — Telephone Encounter (Signed)
Patient c/o Palpitations:  STAT if patient reporting lightheadedness, shortness of breath, or chest pain  How long have you had palpitations/irregular HR/ Afib? Are you having the symptoms now? 2 weeks,yes  Are you currently experiencing lightheadedness, SOB or CP? no  Do you have a history of afib (atrial fibrillation) or irregular heart rhythm? yes  Have you checked your BP or HR? (document readings if available): no  Are you experiencing any other symptoms? No

## 2023-12-02 NOTE — Telephone Encounter (Signed)
Returned call to patient, no answer, left detailed message per [facility] asking patient to call back.

## 2023-12-03 MED ORDER — METOPROLOL SUCCINATE ER 25 MG PO TB24: 25.0000 mg | ORAL_TABLET | Freq: Every day | ORAL | 3 refills | Status: DC

## 2023-12-03 NOTE — Telephone Encounter (Signed)
Call to patient to advise that Toprol XL 25 mg script will be sent to pharmacy. Also advised that Dr. Mayford Knife recommends 2 week Zio, patient verbalizes understanding and requests assistance with placing when she comes to appt on 12/05/23.

## 2023-12-03 NOTE — Addendum Note (Signed)
Addended by: Luellen Pucker on: 12/03/2023 01:48 PM   Modules accepted: Orders

## 2023-12-04 ENCOUNTER — Telehealth: Payer: Self-pay

## 2023-12-04 NOTE — Progress Notes (Unsigned)
Cardiology Office Note:  .   Date:  12/05/2023  ID:  Vickie Perry, DOB 01/19/1961, MRN [id number] PCP: Thana Ates, MD  [facility] Providers Cardiologist:  Armanda Magic, MD    Patient Profile: .      PMH Palpitations Mitral regurgitation Hypertension Coronary calcium score of 0 on CT calcium score 07/05/2017 Coronary calcium score of 0 on CT calcium score 01/17/2023  Initially referred for history of valve disease, hypertension, and palpitations and seen by Dr. Mayford Knife in 2015.  2D echo 12/24/2013 showed normal LVEF and trivial regurgitation of MV, PV, and TV.  She had previously been taking phentermine.  She underwent CT which revealed coronary calcium score of 0.  Repeat 2D echo 07/05/2017 revealed normal LVEF, trivial tricuspid regurgitation and trivial mitral valve regurgitation.  Cardiac monitor 09/2017 revealed occasional PVCs.  She was encouraged to decrease caffeine consumption and try to get adequate sleep.  She was given metoprolol to use as needed.  Last cardiology clinic visit was 01/01/2023 at which time she was not having significant palpitation and had not used any of as needed beta-blocker.  BP was well-controlled.  She underwent repeat CT 01/17/2023 which again showed calcium score of 0.       History of Present Illness: .   Vickie Perry is a very pleasant 63 y.o. female who is here today for evaluation of increased palpitations. She reports a new sensation in the left chest described as a "fluttering or quivering" that has been ongoing for two weeks. The sensation is often noticed at rest but also occurs with activity. She feels it when she is laying down as well but it has not awakened her from sleep. Episodes are 'uncomfortable' and occur multiple times a day, sometimes lasting up to ten seconds. The patient describes the sensation as a 'quivering' or 'fluttering.'  She has a family history of heart disease, including her mother who needed valve  replacement but refused and unfortunately passed away from valve disease and her brother and father who both had CAD, "widow maker" but survived. Admits she has been under significant stress recently due to her daughter's wedding. She exercises regularly with a personal trainer and does not report any chest pain or shortness of breath during exercise. She limits her caffeine and alcohol.  Has been off blood pressure medication for a year and has not had any issues with blood pressure since.   Discussed the use of AI scribe software for clinical note transcription with the patient, who gave verbal consent to proceed.   ROS: See HPI       Studies Reviewed: Marland Kitchen   EKG Interpretation Date/Time:  Thursday December 05 2023 14:53:49 EST Ventricular Rate:  71 PR Interval:  164 QRS Duration:  90 QT Interval:  398 QTC Calculation: 432 R Axis:   -7  Text Interpretation: Normal sinus rhythm Normal ECG No previous ECGs available Confirmed by Eligha Bridegroom [phone]) on 12/05/2023 3:13:24 PM    Risk Assessment/Calculations:             Physical Exam:   VS:  BP 128/74   Pulse 71   Ht 5\' 7"  (1.702 m)   Wt 189 lb (85.7 kg)   LMP 03/17/2013 (Exact Date)   SpO2 97%   BMI 29.60 kg/m    Wt Readings from Last 3 Encounters:  12/05/23 189 lb (85.7 kg)  01/01/23 169 lb (76.7 kg)  02/20/22 190 lb (86.2 kg)    GEN: Well  nourished, well developed in no acute distress NECK: No JVD; No carotid bruits/ CARDIAC: RRR, 2/6 systolic murmur. No rubs, gallops RESPIRATORY:  Clear to auscultation without rales, wheezing or rhonchi  ABDOMEN: Soft, non-tender, non-distended EXTREMITIES:  No edema; No deformity     ASSESSMENT AND PLAN: .    Palpitations: New onset palpitations described as a "quivering" sensation, occurring both at rest and with activity, without associated chest pain or shortness of breath.  EKG today reveals NSR at 71 bpm, no significant abnormality. Previous history of PVCs. We will get a 14 day  Zio monitor to evaluate for arrhythmias. She was given a Rx for Toprol XL but will await completion of monitor prior to starting it.  We will get metabolic panel, TSH, and magnesium levels today to evaluate for additional potential causes of palpitations.   Murmur: She has a murmur on exam.  History of trivial mitral regurgitation and trivial TR on echocardiogram 06/2017. History of valve disease in her mother. We will get an echocardiogram to evaluate for structural heart disease.  I spent 40  minutes today with this patient including review of records and recent labs, consideration of future appointments and testing, and documentation of my findings in the note.         Disposition: Keep your March appointment with Dr. Mayford Knife  Signed, Eligha Bridegroom, NP-C

## 2023-12-04 NOTE — Telephone Encounter (Signed)
Training purpose

## 2023-12-05 ENCOUNTER — Ambulatory Visit: Payer: Managed Care, Other (non HMO) | Attending: Nurse Practitioner | Admitting: Nurse Practitioner

## 2023-12-05 ENCOUNTER — Ambulatory Visit (INDEPENDENT_AMBULATORY_CARE_PROVIDER_SITE_OTHER): Payer: Managed Care, Other (non HMO)

## 2023-12-05 ENCOUNTER — Encounter: Payer: Self-pay | Admitting: Nurse Practitioner

## 2023-12-05 ENCOUNTER — Telehealth: Payer: Self-pay | Admitting: *Deleted

## 2023-12-05 VITALS — BP 128/74 | HR 71 | Ht 67.0 in | Wt 189.0 lb

## 2023-12-05 DIAGNOSIS — I340 Nonrheumatic mitral (valve) insufficiency: Secondary | ICD-10-CM | POA: Diagnosis not present

## 2023-12-05 DIAGNOSIS — R002 Palpitations: Secondary | ICD-10-CM | POA: Diagnosis not present

## 2023-12-05 DIAGNOSIS — R011 Cardiac murmur, unspecified: Secondary | ICD-10-CM | POA: Diagnosis not present

## 2023-12-05 NOTE — Patient Instructions (Signed)
Medication Instructions:   Please do not start your Toprol till after your monitor.   *If you need a refill on your cardiac medications before your next appointment, please call your pharmacy*   Lab Work:  TODAY!!!! CMET./TSH/MAG  If you have labs (blood work) drawn today and your tests are completely normal, you will receive your results only by: MyChart Message (if you have MyChart) OR A paper copy in the mail If you have any lab test that is abnormal or we need to change your treatment, we will call you to review the results.   Testing/Procedures:  Your physician has requested that you have an echocardiogram. Echocardiography is a painless test that uses sound waves to create images of your heart. It provides your doctor with information about the size and shape of your heart and how well your heart's chambers and valves are working. This procedure takes approximately one hour. There are no restrictions for this procedure. Please do NOT wear cologne, perfume,  or lotions (deodorant is allowed). Please arrive 15 minutes prior to your appointment time.  Please note: We ask at that you not bring children with you during ultrasound (echo/ vascular) testing. Due to room size and safety concerns, children are not allowed in the ultrasound rooms during exams. Our front office staff cannot provide observation of children in our lobby area while testing is being conducted. An adult accompanying a patient to their appointment will only be allowed in the ultrasound room at the discretion of the ultrasound technician under special circumstances. We apologize for any inconvenience.  ZIO XT- Long Term Monitor Instructions  Your physician has requested you wear a ZIO patch monitor for 14 days.  This is a single patch monitor. [employer] supplies one patch monitor per enrollment. Additional stickers are not available. Please do not apply patch if you will be having a Nuclear Stress Test,   Echocardiogram, Cardiac CT, MRI, or Chest Xray during the period you would be wearing the  monitor. The patch cannot be worn during these tests. You cannot remove and re-apply the  ZIO XT patch monitor.  Your ZIO patch monitor will be mailed 3 day USPS to your address on file. It may take 3-5 days  to receive your monitor after you have been enrolled.  Once you have received your monitor, please review the enclosed instructions. Your monitor  has already been registered assigning a specific monitor serial # to you.  Billing and Patient Assistance Program Information  We have supplied [employer] with any of your insurance information on file for billing purposes. [employer] offers a sliding scale Patient Assistance Program for patients that do not have  insurance, or whose insurance does not completely cover the cost of the ZIO monitor.  You must apply for the Patient Assistance Program to qualify for this discounted rate.  To apply, please call [employer] at [phone], select option 4, select option 2, ask to apply for  Patient Assistance Program. Meredeth Ide will ask your household income, and how many people  are in your household. They will quote your out-of-pocket cost based on that information.  [employer] will also be able to set up a 79-month, interest-free payment plan if needed.  Applying the monitor   Shave hair from upper left chest.  Hold abrader disc by orange tab. Rub abrader in 40 strokes over the upper left chest as  indicated in your monitor instructions.  Clean area with 4 enclosed alcohol pads. Let dry.  Apply patch as indicated  in monitor instructions. Patch will be placed under collarbone on left  side of chest with arrow pointing upward.  Rub patch adhesive wings for 2 minutes. Remove white label marked "1". Remove the white  label marked "2". Rub patch adhesive wings for 2 additional minutes.  While looking in a mirror, press and release button in center of patch. A small  green light will  flash 3-4 times. This will be your only indicator that the monitor has been turned on.  Do not shower for the first 24 hours. You may shower after the first 24 hours.  Press the button if you feel a symptom. You will hear a small click. Record Date, Time and  Symptom in the Patient Logbook.  When you are ready to remove the patch, follow instructions on the last 2 pages of Patient  Logbook. Stick patch monitor onto the last page of Patient Logbook.  Place Patient Logbook in the blue and white box. Use locking tab on box and tape box closed  securely. The blue and white box has prepaid postage on it. Please place it in the mailbox as  soon as possible. Your physician should have your test results approximately 7 days after the  monitor has been mailed back to [facility].  Call [facility] Customer Care at [phone] if you have questions regarding  your ZIO XT patch monitor. Call them immediately if you see an orange light blinking on your  monitor.  If your monitor falls off in less than 4 days, contact our Monitor department at [phone].  If your monitor becomes loose or falls off after 4 days call [employer] at [phone] for  suggestions on securing your monitor   Follow-Up: At [facility], you and your health needs are our priority.  As part of our continuing mission to provide you with exceptional heart care, we have created designated Provider Care Teams.  These Care Teams include your primary Cardiologist (physician) and Advanced Practice Providers (APPs -  Physician Assistants and Nurse Practitioners) who all work together to provide you with the care you need, when you need it.  We recommend signing up for the patient portal called "MyChart".  Sign up information is provided on this After Visit Summary.  MyChart is used to connect with patients for Virtual Visits (Telemedicine).  Patients are able to view lab/test results, encounter notes,  upcoming appointments, etc.  Non-urgent messages can be sent to your provider as well.   To learn more about what you can do with MyChart, go to [email or web address].    Your next appointment:   2 month(s)  Provider:   Armanda Magic, MD     Other Instructions    1st Floor: - Lobby - Registration  - Pharmacy  - Lab - Cafe  2nd Floor: - PV Lab - Diagnostic Testing (echo, CT, nuclear med)  3rd Floor: - Vacant  4th Floor: - TCTS (cardiothoracic surgery) - AFib Clinic - Structural Heart Clinic - Vascular Surgery  - Vascular Ultrasound  5th Floor: - HeartCare Cardiology (general and EP) - Clinical Pharmacy for coumadin, hypertension, lipid, weight-loss medications, and med management appointments    Valet parking services will be available as well.

## 2023-12-05 NOTE — Progress Notes (Unsigned)
Zio serial# [id number] from office inventory applied to patient.  Dr. Mayford Knife to read.

## 2023-12-05 NOTE — Telephone Encounter (Signed)
Lvm to get last TSH.

## 2023-12-06 LAB — COMPREHENSIVE METABOLIC PANEL
ALT: 18 [IU]/L (ref 0–32)
AST: 25 [IU]/L (ref 0–40)
Albumin: 4.7 g/dL (ref 3.8–4.9)
Alkaline Phosphatase: 86 [IU]/L (ref 44–121)
BUN/Creatinine Ratio: 18 (ref 12–28)
BUN: 18 mg/dL (ref 8–27)
Bilirubin Total: 0.4 mg/dL (ref 0.0–1.2)
CO2: 24 mmol/L (ref 20–29)
Calcium: 10.3 mg/dL (ref 8.7–10.3)
Chloride: 100 mmol/L (ref 96–106)
Creatinine, Ser: 1.02 mg/dL — ABNORMAL HIGH (ref 0.57–1.00)
Globulin, Total: 2.8 g/dL (ref 1.5–4.5)
Glucose: 95 mg/dL (ref 70–99)
Potassium: 4.7 mmol/L (ref 3.5–5.2)
Sodium: 141 mmol/L (ref 134–144)
Total Protein: 7.5 g/dL (ref 6.0–8.5)
eGFR: 63 mL/min/{1.73_m2} (ref >59)

## 2023-12-06 LAB — TSH: TSH: 0.806 u[IU]/mL (ref 0.450–4.500)

## 2023-12-06 LAB — MAGNESIUM: Magnesium: 2.4 mg/dL — ABNORMAL HIGH (ref 1.6–2.3)

## 2023-12-13 ENCOUNTER — Ambulatory Visit: Payer: Managed Care, Other (non HMO) | Admitting: Physician Assistant

## 2023-12-23 ENCOUNTER — Ambulatory Visit (HOSPITAL_COMMUNITY): Payer: Managed Care, Other (non HMO) | Attending: Cardiology

## 2023-12-23 DIAGNOSIS — R002 Palpitations: Secondary | ICD-10-CM

## 2023-12-23 DIAGNOSIS — I340 Nonrheumatic mitral (valve) insufficiency: Secondary | ICD-10-CM

## 2023-12-23 LAB — ECHOCARDIOGRAM COMPLETE
Area-P 1/2: 3.21 cm2
S' Lateral: 3.2 cm

## 2023-12-25 ENCOUNTER — Other Ambulatory Visit: Payer: Self-pay

## 2023-12-25 DIAGNOSIS — R002 Palpitations: Secondary | ICD-10-CM

## 2023-12-25 MED ORDER — METOPROLOL SUCCINATE ER 25 MG PO TB24: 25.0000 mg | ORAL_TABLET | Freq: Every day | ORAL | 1 refills | Status: DC

## 2023-12-25 MED ORDER — METOPROLOL TARTRATE 25 MG PO TABS: 12.5000 mg | ORAL_TABLET | ORAL | 0 refills | Status: DC | PRN

## 2023-12-25 MED ORDER — METOPROLOL TARTRATE 25 MG PO TABS: 12.5000 mg | ORAL_TABLET | Freq: Every day | ORAL | 3 refills | Status: AC | PRN

## 2024-01-06 ENCOUNTER — Ambulatory Visit: Payer: Managed Care, Other (non HMO) | Attending: Cardiology | Admitting: Cardiology

## 2024-01-06 ENCOUNTER — Encounter: Payer: Self-pay | Admitting: Cardiology

## 2024-01-06 ENCOUNTER — Ambulatory Visit (INDEPENDENT_AMBULATORY_CARE_PROVIDER_SITE_OTHER): Payer: Managed Care, Other (non HMO) | Admitting: Obstetrics and Gynecology

## 2024-01-06 VITALS — BP 104/76 | HR 67

## 2024-01-06 VITALS — BP 102/60 | HR 72 | Ht 67.0 in | Wt 185.0 lb

## 2024-01-06 DIAGNOSIS — E2839 Other primary ovarian failure: Secondary | ICD-10-CM | POA: Diagnosis not present

## 2024-01-06 DIAGNOSIS — I491 Atrial premature depolarization: Secondary | ICD-10-CM | POA: Diagnosis not present

## 2024-01-06 DIAGNOSIS — I10 Essential (primary) hypertension: Secondary | ICD-10-CM | POA: Diagnosis not present

## 2024-01-06 DIAGNOSIS — I340 Nonrheumatic mitral (valve) insufficiency: Secondary | ICD-10-CM

## 2024-01-06 DIAGNOSIS — R002 Palpitations: Secondary | ICD-10-CM | POA: Diagnosis not present

## 2024-01-06 DIAGNOSIS — N858 Other specified noninflammatory disorders of uterus: Secondary | ICD-10-CM

## 2024-01-06 DIAGNOSIS — I493 Ventricular premature depolarization: Secondary | ICD-10-CM

## 2024-01-06 DIAGNOSIS — Z8742 Personal history of other diseases of the female genital tract: Secondary | ICD-10-CM

## 2024-01-06 DIAGNOSIS — Z8249 Family history of ischemic heart disease and other diseases of the circulatory system: Secondary | ICD-10-CM

## 2024-01-06 NOTE — Progress Notes (Signed)
 Cardiology Office Note:    Date:  01/06/2024   ID:  Vickie Perry, DOB [birth year], MRN [id number]  PCP:  Thana Ates, MD  Cardiologist:  Armanda Magic, MD    Referring MD: Thana Ates, MD   Chief Complaint  Patient presents with   Follow-up    Mitral regurgitation, aortic insufficiency, pulmonic regurgitation, hypertension and family history of CAD    History of Present Illness:    Vickie Perry is a 63 y.o. female with a hx of mild MR/AR/PR, HTN, family history of CAD with coronary calcium score of 0 and history of palpitations with no significant arrhythmias on heart monitor.   She called in with palpitations earlier this year and wore a heart monitor 12/24/2023 showing normal sinus rhythm with rare PACs and PVCs. Her daughter got married around the same time that she was having the palpitations.   She has not started on Toprol yet but has tried the short acting one which helps.   She is here today for followup and is doing well.  She denies any chest pain or pressure, SOB, DOE, PND, orthopnea, LE edema, dizziness or syncope. Her palpitations have significantly improved.  She is compliant with her meds and is tolerating meds with no SE.    Past Medical History:  Diagnosis Date   Allergic rhinitis    Anxiety    Asthma    Depression    2005   Dyslipidemia    Edema in pregnancy    Exercise induced bronchospasm    Family history of breast cancer    maternal second cousin   Family history of early CAD 07/04/2017   Family history of lung cancer    Family history of prostate cancer    Family history of thyroid cancer    HSV-2 (herpes simplex virus 2) infection    Hypertension    Hypophysitis (HCC)    H/O Autoimmune   Mitral regurgitation    Palpitations    Panic attack    Perennial allergic rhinitis    PIH (pregnancy induced hypertension)    P partum// severe    Tonsillar calculus 05/2012   Dr Karren Burly bates   Torn meniscus     Past Surgical History:   Procedure Laterality Date   ABLATION  2007   HTA   CESAREAN SECTION     x 2// Due to HSV2   FOOT SURGERY     KNEE SURGERY Left 2019   plastic eye surgery      Current Medications: Current Meds  Medication Sig   albuterol (PROVENTIL HFA;VENTOLIN HFA) 108 (90 BASE) MCG/ACT inhaler Inhale 2 puffs into the lungs every 4 (four) hours as needed for wheezing or shortness of breath.    Ascorbic Acid (VITAMIN C) 500 MG CAPS See admin instructions.   Cholecalciferol (CVS D3) 50 MCG (2000 UT) CAPS Take 1 capsule by mouth.   Clindamycin-Benzoyl Per, Refr, gel    clonazePAM (KLONOPIN) 1 MG tablet Take 1 mg by mouth daily.   metoprolol succinate (TOPROL XL) 25 MG 24 hr tablet Take 1 tablet (25 mg total) by mouth daily.   metoprolol tartrate (LOPRESSOR) 25 MG tablet Take 0.5 tablets (12.5 mg total) by mouth daily as needed (for breakthrough palpitations).   QVAR 80 MCG/ACT inhaler Inhale 2 puffs into the lungs as needed.    rosuvastatin (CRESTOR) 10 MG tablet Take 5 mg by mouth daily.   sertraline (ZOLOFT) 50 MG tablet take 1/2 daily   tretinoin (  RETIN-A) 0.05 % cream Apply topically at bedtime.   triamcinolone cream (KENALOG) 0.1 % Apply 1 application. topically as needed. Reported on 11/17/2015   valACYclovir (VALTREX) 500 MG tablet Take one tablet po bid x 3 days prn     Allergies:   Erythromycin and Other   Social History   Socioeconomic History   Marital status: Married    Spouse name: Not on file   Number of children: Not on file   Years of education: Not on file   Highest education level: Not on file  Occupational History   Not on file  Tobacco Use   Smoking status: Never   Smokeless tobacco: Never  Vaping Use   Vaping status: Never Used  Substance and Sexual Activity   Alcohol use: Yes    Alcohol/week: 1.0 standard drink of alcohol    Types: 1 Standard drinks or equivalent per week   Drug use: No   Sexual activity: Yes    Partners: Male    Birth control/protection:  Other-see comments, Post-menopausal    Comment: vasectomy  Other Topics Concern   Not on file  Social History Narrative   Not on file   Social Drivers of Health   Financial Resource Strain: Not on file  Food Insecurity: Low Risk  (05/13/2023)   Received from [facility]   Hunger Vital Sign    Worried About Running Out of Food in the Last Year: Never true    Ran Out of Food in the Last Year: Never true  Transportation Needs: No Transportation Needs (03/21/2023)   [facility]   Transportation    In the past 12 months, has lack of reliable transportation kept you from medical appointments, meetings, work or from getting things needed for daily living? : No  Physical Activity: Not on file  Stress: Not on file  Social Connections: Not on file     Family History: The patient's family history includes Alzheimer's disease in her maternal grandmother; Breast cancer (age of onset: 19) in an other family member; Cancer in her maternal uncle, paternal grandfather, paternal uncle, paternal uncle, and another family member; Heart disease in her brother, brother, father, maternal grandfather, and mother; Hyperlipidemia in her brother, brother, brother, brother, and brother; Lung cancer in her paternal aunt; Prostate cancer (age of onset: 38) in her father; Thyroid cancer in her paternal grandmother; Thyroid cancer (age of onset: 18) in her brother.  ROS:   Please see the history of present illness.    ROS  All other systems reviewed and negative.   EKGs/Labs/Other Studies Reviewed:    The following studies were reviewed today: none   Recent Labs: 12/05/2023: ALT 18; BUN 18; Creatinine, Ser 1.02; Magnesium 2.4; Potassium 4.7; Sodium 141; TSH 0.806   Recent Lipid Panel    Component Value Date/Time   CHOL 172 03/27/2016 1535   TRIG 164 (H) 03/27/2016 1535   HDL 58 03/27/2016 1535   CHOLHDL 3.0 03/27/2016 1535   VLDL 33 (H) 03/27/2016 1535   LDLCALC 81  03/27/2016 1535    Physical Exam:    VS:  BP 102/60   Pulse 72   Ht 5\' 7"  (1.702 m)   Wt 185 lb (83.9 kg)   LMP 03/17/2013 (Exact Date)   SpO2 99%   BMI 28.98 kg/m     Wt Readings from Last 3 Encounters:  01/06/24 185 lb (83.9 kg)  12/05/23 189 lb (85.7 kg)  01/01/23 169 lb (76.7 kg)  GEN: Well nourished, well developed in no acute distress HEENT: Normal NECK: No JVD; No carotid bruits LYMPHATICS: No lymphadenopathy CARDIAC:RRR, no murmurs, rubs, gallops RESPIRATORY:  Clear to auscultation without rales, wheezing or rhonchi  ABDOMEN: Soft, non-tender, non-distended MUSCULOSKELETAL:  No edema; No deformity  SKIN: Warm and dry NEUROLOGIC:  Alert and oriented x 3 PSYCHIATRIC:  Normal affect  ASSESSMENT:    1. Nonrheumatic mitral valve regurgitation   2. Benign essential HTN   3. Palpitations   4. PAC (premature atrial contraction)   5. PVC (premature ventricular contraction)   6. Family history of early CAD     PLAN:    In order of problems listed above:  1. Mitral regurgitation -trivial on echo 2018 -2D echo 12/2023 showed EF 55-60%, trivial MR, mild calcification with no stenosis of the AV  2.  HTN -BP is at controlled on exam today -diet controlled  3.  Palpitations -She had some palpitations earlier in the year with heart monitor showing rare PACs and PVCs -Palpitations have improved -Her BP is soft and so I told her not to take the Toprol  given her soft BP and just take Lopressor tartrate to 12.5mg  PRN for palpitations  4.  Family hx of premature CAD -coronary Ca score was 0 in 2018 and 2024 -I have personally reviewed and interpreted outside labs performed by patient's PCP which showed LDL 89, HDL 54 on 12/24/2023 -continue prescription drug management with Crestor 5mg  daily with PRN refills   Medication Adjustments/Labs and Tests Ordered: Current medicines are reviewed at length with the patient today.  Concerns regarding medicines are outlined  above.  No orders of the defined types were placed in this encounter.  No orders of the defined types were placed in this encounter.   Signed, Armanda Magic, MD  01/06/2024 11:15 AM    [facility]

## 2024-01-06 NOTE — Patient Instructions (Signed)
 Medication Instructions:  The current medical regimen is effective;  continue present plan and medications.  *If you need a refill on your cardiac medications before your next appointment, please call your pharmacy*  Follow-Up: At [facility], you and your health needs are our priority.  As part of our continuing mission to provide you with exceptional heart care, we have created designated Provider Care Teams.  These Care Teams include your primary Cardiologist (physician) and Advanced Practice Providers (APPs -  Physician Assistants and Nurse Practitioners) who all work together to provide you with the care you need, when you need it.  We recommend signing up for the patient portal called "MyChart".  Sign up information is provided on this After Visit Summary.  MyChart is used to connect with patients for Virtual Visits (Telemedicine).  Patients are able to view lab/test results, encounter notes, upcoming appointments, etc.  Non-urgent messages can be sent to your provider as well.   To learn more about what you can do with MyChart, go to [email or web address].    Your next appointment:   1 year(s)  Provider:   Armanda Magic, MD       1st Floor: - Lobby - Registration  - Pharmacy  - Lab - Cafe  2nd Floor: - PV Lab - Diagnostic Testing (echo, CT, nuclear med)  3rd Floor: - Vacant  4th Floor: - TCTS (cardiothoracic surgery) - AFib Clinic - Structural Heart Clinic - Vascular Surgery  - Vascular Ultrasound  5th Floor: - HeartCare Cardiology (general and EP) - Clinical Pharmacy for coumadin, hypertension, lipid, weight-loss medications, and med management appointments    Valet parking services will be available as well.

## 2024-01-06 NOTE — Progress Notes (Signed)
   Acute Office Visit  Subjective:    Patient ID: Vickie Perry, female    DOB: Sep 21, 1961, 63 y.o.   MRN: [id number]   HPI 63 y.o. presents today for Consult (Discuss pap and labs) .ASCUS HPV DNA negative Had MRI scan at [facility]. 6mm myometrial cyst seen.  Labs showed low testosterone and progesterone. She has intrarosa but has not started using it.  Patient's last menstrual period was 03/17/2013 (exact date).    Review of Systems     Objective:    OBGyn Exam  BP 104/76 (BP Location: Right Arm, Patient Position: Sitting, Cuff Size: Normal)   Pulse 67   LMP 03/17/2013 (Exact Date)   SpO2 97%  Wt Readings from Last 3 Encounters:  01/06/24 185 lb (83.9 kg)  12/05/23 189 lb (85.7 kg)  01/01/23 169 lb (76.7 kg)        Patient informed chaperone available to be present for breast and/or pelvic exam. Patient has requested no chaperone to be present. Patient has been advised what will be completed during breast and pelvic exam.   Assessment & Plan:  Pap smear: discussed likely from inflammation from atrophic vaginitis.  She would like to have a repeat in 6 months and will pay if insurance doesn't cover. Myometrial cyst. Discussed benign nature. To get TV US to evaluate uterine lining and ovaries.  She agreed Patient to begin intrarosa. May space out instead of every night based on her needs. She agreed  spent on reviewing records, imaging,  and one on one patient time and counseling patient and documentation Dr. Karma Greaser  Dr. Judith Blonder

## 2024-01-09 ENCOUNTER — Ambulatory Visit (INDEPENDENT_AMBULATORY_CARE_PROVIDER_SITE_OTHER): Payer: Managed Care, Other (non HMO) | Admitting: Podiatry

## 2024-01-09 ENCOUNTER — Ambulatory Visit (INDEPENDENT_AMBULATORY_CARE_PROVIDER_SITE_OTHER)

## 2024-01-09 ENCOUNTER — Encounter: Payer: Self-pay | Admitting: Podiatry

## 2024-01-09 DIAGNOSIS — M21619 Bunion of unspecified foot: Secondary | ICD-10-CM

## 2024-01-09 DIAGNOSIS — G8918 Other acute postprocedural pain: Secondary | ICD-10-CM

## 2024-01-09 DIAGNOSIS — M7751 Other enthesopathy of right foot: Secondary | ICD-10-CM | POA: Diagnosis not present

## 2024-01-09 MED ORDER — TRIAMCINOLONE ACETONIDE 10 MG/ML IJ SUSP
10.0000 mg | Freq: Once | INTRAMUSCULAR | Status: AC
  Administered 2024-01-09: 10 mg via INTRA_ARTICULAR

## 2024-01-09 NOTE — Progress Notes (Signed)
 Subjective:   Patient ID: Vickie Perry, female   DOB: 63 y.o.   MRN: [id number]   HPI Patient presents stating that she started to develop pain in her right foot over the last 6 months to a year and states that it can be irritative and she is not sure why.  Also has moderate bunion left that she wanted to have checked and patient does not smoke likes to be active and has not been seen in approximately 8 years   Review of Systems  All other systems reviewed and are negative.       Objective:  Physical Exam Vitals and nursing note reviewed.  Constitutional:      Appearance: She is well-developed.  Pulmonary:     Effort: Pulmonary effort is normal.  Musculoskeletal:        General: Normal range of motion.  Skin:    General: Skin is warm.  Neurological:     Mental Status: She is alert.   Neurovascular status intact muscle strength found to be adequate with inflammation fluid around the first MPJ on the medial side.  Did not note any prominence of the pin from the surgery of approximate 10 years ago and everything else looks normal as far as what I can tell.  Moderate prominence of the first metatarsal head left localized     Assessment:  Inflammatory capsulitis around the first MPJ no indications of structural deformity with patient found to have moderate structural deformity around the left first metatarsal     Plan:  H&P x-rays reviewed and for the right sterile prep injected around the first MPJ 3 mg Dexasone Kenalog 5 mg Xylocaine to reduce the inflammation and at 1 point in future left may require correction but definitely would hold off currently  X-rays indicate that there is moderate bunion deformity left excellent correction right good alignment noted joint congruence

## 2024-01-21 ENCOUNTER — Encounter: Payer: Managed Care, Other (non HMO) | Admitting: Sports Medicine

## 2024-02-04 ENCOUNTER — Ambulatory Visit (HOSPITAL_BASED_OUTPATIENT_CLINIC_OR_DEPARTMENT_OTHER): Admission: RE | Admit: 2024-02-04 | Source: Ambulatory Visit

## 2024-02-04 ENCOUNTER — Ambulatory Visit (INDEPENDENT_AMBULATORY_CARE_PROVIDER_SITE_OTHER): Payer: Managed Care, Other (non HMO) | Admitting: Sports Medicine

## 2024-02-04 VITALS — BP 116/82 | Ht 67.0 in | Wt 185.0 lb

## 2024-02-04 DIAGNOSIS — M19072 Primary osteoarthritis, left ankle and foot: Secondary | ICD-10-CM | POA: Diagnosis not present

## 2024-02-04 DIAGNOSIS — M19071 Primary osteoarthritis, right ankle and foot: Secondary | ICD-10-CM | POA: Diagnosis not present

## 2024-02-04 DIAGNOSIS — M19079 Primary osteoarthritis, unspecified ankle and foot: Secondary | ICD-10-CM | POA: Diagnosis not present

## 2024-02-04 NOTE — Assessment & Plan Note (Signed)
 Her midfoot pain from arthritis has largely resolved since she is using her custom orthotics  We will continue her in these She definitely should wear them for her workout sessions which she is working with a Systems analyst at least 3 days a week

## 2024-02-04 NOTE — Progress Notes (Signed)
 Patient comes in for 2 new pairs of orthotics today Her last ones were made 2 years ago Before orthotics she had recurrent plantar fascia pain and pain in her feet and ankles Since then she has been able to wear these and participate in a much more active walking and exercise program  Physical exam of the feet show significant resting pronation with loss of longitudinal arch There is mild flattening of the transverse arch Great toe motion is normal  Patient was fitted for a : standard, cushioned, semi-rigid orthotic. The orthotic was heated and afterward the patient stood on the orthotic blank positioned on the orthotic stand. The patient was positioned in subtalar neutral position and 10 degrees of ankle dorsiflexion in a weight bearing stance. After completion of molding, a stable base was applied to the orthotic blank. The blank was ground to a stable position for weight bearing. Size: 7 fit and run Base: Incorporated in the orthotic Posting: None, we tried a first ray post but that was uncomfortable so we remove those Additional orthotic padding: none  2 pairs were prepared today and after completion the patient had a neutral walking gait and felt very comfortable in the orthotics

## 2024-02-13 ENCOUNTER — Other Ambulatory Visit: Payer: Self-pay | Admitting: Obstetrics and Gynecology

## 2024-02-13 ENCOUNTER — Encounter: Payer: Self-pay | Admitting: Obstetrics and Gynecology

## 2024-02-13 ENCOUNTER — Ambulatory Visit (INDEPENDENT_AMBULATORY_CARE_PROVIDER_SITE_OTHER)

## 2024-02-13 ENCOUNTER — Ambulatory Visit (INDEPENDENT_AMBULATORY_CARE_PROVIDER_SITE_OTHER): Admitting: Obstetrics and Gynecology

## 2024-02-13 DIAGNOSIS — Z8619 Personal history of other infectious and parasitic diseases: Secondary | ICD-10-CM | POA: Diagnosis not present

## 2024-02-13 DIAGNOSIS — N858 Other specified noninflammatory disorders of uterus: Secondary | ICD-10-CM

## 2024-02-13 MED ORDER — VALACYCLOVIR HCL 500 MG PO TABS: ORAL_TABLET | ORAL | 0 refills | Status: DC

## 2024-02-13 NOTE — Progress Notes (Signed)
   Acute Office Visit PUS results  Subjective:    Patient ID: Vickie Perry, female    DOB: 01-28-1961, 63 y.o.   MRN: [id number]   HPI 63 y.o. presents today for ultrasound & consult (Ultrasound & consult//jj) .ASCUS HPV DNA negative Had [facility] scan at [facility]. 6mm myometrial cyst seen.  Labs showed low testosterone and progesterone. She has intrarosa but has started using it. No PMB  Patient would also like refill on valtrex for oral cold sores  Patient's last menstrual period was 03/17/2013 (exact date).    Review of Systems     Objective:    OBGyn Exam  BP 110/74   Pulse 77   LMP 03/17/2013 (Exact Date)   SpO2 97%  Wt Readings from Last 3 Encounters:  02/04/24 185 lb (83.9 kg)  01/06/24 185 lb (83.9 kg)  12/05/23 189 lb (85.7 kg)      PUS 4.01cm uterus Endometrial lining is normal at 3.87mm Atrophic ovaries A 6.3 x 3.12mm Simple myometrial cyst noted anteriorly ?3.90mm submucosal fibroid noted  No adnexal masses No free fluid   Patient informed chaperone available to be present for breast and/or pelvic exam. Patient has requested no chaperone to be present. Patient has been advised what will be completed during breast and pelvic exam.   Assessment & Plan:  PUS results discussed with patient.  Most likely adenomyosis cyst, which can cause abnormal bleeding during menses along with the submucosal fibroid.  Counseled on benign nature, considering how small, but encouraged patient to return with any PMB and she agreed.  15 minutes spent on reviewing records, imaging,  and one on one patient time and counseling patient and documentation Dr. Karma Greaser  Dr. Judith Blonder

## 2024-02-25 ENCOUNTER — Telehealth: Payer: Self-pay

## 2024-02-25 ENCOUNTER — Other Ambulatory Visit: Payer: Self-pay

## 2024-02-25 NOTE — Telephone Encounter (Signed)
 Patient called & left voicemail on triage line. She stated her bone density order expires 03-06-24 & she needs it to be extended because her appt is the end of May. I extended her order until the end of June. Patient is aware.

## 2024-04-01 ENCOUNTER — Ambulatory Visit: Payer: Self-pay | Admitting: Obstetrics and Gynecology

## 2024-04-01 ENCOUNTER — Ambulatory Visit (HOSPITAL_BASED_OUTPATIENT_CLINIC_OR_DEPARTMENT_OTHER)
Admission: RE | Admit: 2024-04-01 | Discharge: 2024-04-01 | Disposition: A | Discharge status: Home / Self Care | Source: Ambulatory Visit | Attending: Obstetrics and Gynecology | Admitting: Obstetrics and Gynecology

## 2024-04-01 DIAGNOSIS — Z8742 Personal history of other diseases of the female genital tract: Secondary | ICD-10-CM | POA: Diagnosis present

## 2024-04-01 DIAGNOSIS — E2839 Other primary ovarian failure: Secondary | ICD-10-CM | POA: Insufficient documentation

## 2024-05-14 ENCOUNTER — Other Ambulatory Visit (HOSPITAL_COMMUNITY)
Admission: RE | Admit: 2024-05-14 | Discharge: 2024-05-14 | Disposition: A | Discharge status: Home / Self Care | Source: Ambulatory Visit | Attending: Obstetrics and Gynecology | Admitting: Obstetrics and Gynecology

## 2024-05-14 ENCOUNTER — Encounter: Payer: Self-pay | Admitting: Obstetrics and Gynecology

## 2024-05-14 ENCOUNTER — Ambulatory Visit: Admitting: Obstetrics and Gynecology

## 2024-05-14 VITALS — BP 118/84 | HR 73

## 2024-05-14 DIAGNOSIS — Z8742 Personal history of other diseases of the female genital tract: Secondary | ICD-10-CM

## 2024-05-14 NOTE — Addendum Note (Signed)
 Addended by: GLENNON ALMARIE POUR on: 05/14/2024 02:30 PM   Modules accepted: Orders

## 2024-05-14 NOTE — Progress Notes (Signed)
   Acute Office Visit  Subjective:    Patient ID: Vickie Perry, female    DOB: August 09, 1961, 63 y.o.   MRN: [id number]   HPI 63 y.o. presents today for Office Visit (Repeat pap) Last pap smear ascus HPV DNA neg. She would like to have another repeat pap smear. Using intrarosa  and intercourse is not as painful now.  Patient's last menstrual period was 03/17/2013 (exact date).    Review of Systems     Objective:    OBGyn Exam  BP 118/84 (BP Location: Left Arm, Patient Position: Sitting)   Pulse 73   LMP 03/17/2013 (Exact Date)   SpO2 98%  Wt Readings from Last 3 Encounters:  02/04/24 185 lb (83.9 kg)  01/06/24 185 lb (83.9 kg)  12/05/23 189 lb (85.7 kg)        Vickie Perry, Vickie Perry was present for the exam SVE: mucosa improved no lesions  Assessment & Plan:  Ascus pap smear Repeat pap smear collected Dyspareunia: continue intrarosa   Vickie Perry

## 2024-05-21 ENCOUNTER — Ambulatory Visit: Payer: Self-pay | Admitting: Obstetrics and Gynecology

## 2024-05-21 LAB — CYTOLOGY - PAP: Diagnosis: NEGATIVE

## 2024-08-11 ENCOUNTER — Other Ambulatory Visit: Payer: Self-pay | Admitting: Cardiology

## 2024-08-11 DIAGNOSIS — R002 Palpitations: Secondary | ICD-10-CM

## 2024-10-05 LAB — HM MAMMOGRAPHY

## 2024-10-06 ENCOUNTER — Ambulatory Visit: Payer: Self-pay | Admitting: Obstetrics and Gynecology

## 2024-11-21 ENCOUNTER — Other Ambulatory Visit: Payer: Self-pay | Admitting: Cardiology

## 2024-11-25 ENCOUNTER — Ambulatory Visit: Admitting: Obstetrics and Gynecology

## 2024-11-25 ENCOUNTER — Ambulatory Visit: Payer: 59 | Admitting: Obstetrics and Gynecology

## 2024-11-25 ENCOUNTER — Other Ambulatory Visit (HOSPITAL_COMMUNITY)
Admission: RE | Admit: 2024-11-25 | Discharge: 2024-11-25 | Disposition: A | Source: Ambulatory Visit | Attending: Obstetrics and Gynecology | Admitting: Obstetrics and Gynecology

## 2024-11-25 ENCOUNTER — Encounter: Payer: Self-pay | Admitting: Obstetrics and Gynecology

## 2024-11-25 VITALS — BP 116/72 | HR 80 | Ht 65.75 in

## 2024-11-25 DIAGNOSIS — Z8619 Personal history of other infectious and parasitic diseases: Secondary | ICD-10-CM

## 2024-11-25 DIAGNOSIS — N952 Postmenopausal atrophic vaginitis: Secondary | ICD-10-CM | POA: Diagnosis not present

## 2024-11-25 DIAGNOSIS — D219 Benign neoplasm of connective and other soft tissue, unspecified: Secondary | ICD-10-CM | POA: Insufficient documentation

## 2024-11-25 DIAGNOSIS — Z8742 Personal history of other diseases of the female genital tract: Secondary | ICD-10-CM | POA: Insufficient documentation

## 2024-11-25 DIAGNOSIS — E2839 Other primary ovarian failure: Secondary | ICD-10-CM

## 2024-11-25 DIAGNOSIS — Z01419 Encounter for gynecological examination (general) (routine) without abnormal findings: Secondary | ICD-10-CM | POA: Diagnosis not present

## 2024-11-25 MED ORDER — VALACYCLOVIR HCL 500 MG PO TABS: ORAL_TABLET | ORAL | 6 refills | Status: AC

## 2024-11-25 MED ORDER — INTRAROSA 6.5 MG VA INST: 1.0000 | VAGINAL_INSERT | Freq: Every evening | VAGINAL | 12 refills | Status: DC | PRN

## 2024-11-25 NOTE — Progress Notes (Signed)
 "   64 y.o. y.o. female here for annual exam. Patient's last menstrual period was 03/17/2013.    [id number] Married White or Caucasian Not Hispanic or Latino female here for annual exam.     She had an ultrasound for postcoital bleeding 2021. U/S was normal. Bleeding was felt to be from atrophy, she was treated with vaginal estrogen. She used the vaginal estrogen for 6 months, didn't bleed during that time. She ended up stopping it, didn't want the medication. Reports doing intrarosa  off and on and would like a refill. Reports lack of desire and dyspareunia. No other vaginal bleeding.  H/O genital hsv, takes prn medication.  Having ~3 outbreaks a year.   No bowel or bladder issues.   She reports a 20 lb weight loss this past year.   Patient's last menstrual period was 03/17/2013 (exact date).          Sexually active: Yes.    The current method of family planning is post menopausal status.    Exercising: Yes.    Gym/ health club routine includes cardio and light weights. Smoker:  no  Health Maintenance: Pap: 2022 WNL, neg HPV; 08/19/19 negative; 07-23-18 neg HPV HR neg History of abnormal Pap:  no MMG:  10/05/24  BMD:   03/29/22 normal T 0.7 repeat 5 years Colonoscopy: 2015 f/u 10 years - 2025. To get records.  States to repeat in 10 years. TDaP:  2017 Gardasil: n/a There is no height or weight on file to calculate BMI.  Pulse 80, height 5' 5.75 (1.67 m), last menstrual period 03/17/2013, SpO2 98%.  Last menstrual period 03/17/2013.     Component Value Date/Time   DIAGPAP  05/14/2024 1430    - Negative for intraepithelial lesion or malignancy (NILM)   DIAGPAP (A) 11/20/2023 1136    - Atypical squamous cells of undetermined significance (ASC-US )   DIAGPAP  09/06/2021 1040    - Negative for intraepithelial lesion or malignancy (NILM)   HPVHIGH Negative 11/20/2023 1136   HPVHIGH Negative 09/06/2021 1040   ADEQPAP  05/14/2024 1430    Satisfactory for evaluation; transformation  zone component PRESENT.   ADEQPAP  11/20/2023 1136    Satisfactory for evaluation; transformation zone component PRESENT.   ADEQPAP  09/06/2021 1040    Satisfactory for evaluation; transformation zone component PRESENT.    GYN HISTORY:    Component Value Date/Time   DIAGPAP  05/14/2024 1430    - Negative for intraepithelial lesion or malignancy (NILM)   DIAGPAP (A) 11/20/2023 1136    - Atypical squamous cells of undetermined significance (ASC-US )   DIAGPAP  09/06/2021 1040    - Negative for intraepithelial lesion or malignancy (NILM)   HPVHIGH Negative 11/20/2023 1136   HPVHIGH Negative 09/06/2021 1040   ADEQPAP  05/14/2024 1430    Satisfactory for evaluation; transformation zone component PRESENT.   ADEQPAP  11/20/2023 1136    Satisfactory for evaluation; transformation zone component PRESENT.   ADEQPAP  09/06/2021 1040    Satisfactory for evaluation; transformation zone component PRESENT.    OB History  Gravida Para Term Preterm AB Living  6 2   4 2   SAB IAB Ectopic Multiple Live Births          # Outcome Date GA Lbr Len/2nd Weight Sex Type Anes PTL Lv  6 AB           5 AB           4 AB  3 AB           2 Para           1 Para             Past Medical History:  Diagnosis Date   Allergic rhinitis    Anxiety    Asthma    Depression    2005   Dyslipidemia    Edema in pregnancy    Exercise induced bronchospasm    Family history of breast cancer    maternal second cousin   Family history of early CAD 07/04/2017   Family history of lung cancer    Family history of prostate cancer    Family history of thyroid  cancer    HSV-2 (herpes simplex virus 2) infection    Hypertension    Hypophysitis    H/O Autoimmune   Mitral regurgitation    Palpitations    Panic attack    Perennial allergic rhinitis    PIH (pregnancy induced hypertension)    P partum// severe    Tonsillar calculus 05/2012   Dr Vaughan bates   Torn meniscus     Past Surgical History:   Procedure Laterality Date   ABLATION  2007   HTA   CESAREAN SECTION     x 2// Due to HSV2   FOOT SURGERY     KNEE SURGERY Left 2019   plastic eye surgery      Medications Ordered Prior to Encounter[1]  Social History   Socioeconomic History   Marital status: Married    Spouse name: Not on file   Number of children: Not on file   Years of education: Not on file   Highest education level: Not on file  Occupational History   Not on file  Tobacco Use   Smoking status: Never   Smokeless tobacco: Never  Vaping Use   Vaping status: Never Used  Substance and Sexual Activity   Alcohol use: Yes    Alcohol/week: 1.0 standard drink of alcohol    Types: 1 Standard drinks or equivalent per week   Drug use: No   Sexual activity: Yes    Partners: Male    Birth control/protection: Other-see comments, Post-menopausal    Comment: vasectomy  Other Topics Concern   Not on file  Social History Narrative   Not on file   Social Drivers of Health   Tobacco Use: Low Risk (08/17/2024)   Received from [facility]   Patient History    Passive Exposure: Not on file    Smoking Tobacco Use: Never    Smokeless Tobacco Use: Never  Financial Resource Strain: Not on file  Food Insecurity: Low Risk (05/13/2023)   Received from [facility]   Epic    Within the past 12 months, you worried that your food would run out before you got money to buy more: Never true    Within the past 12 months, the food you bought just didn't last and you didn't have money to get more. : Never true  Transportation Needs: No Transportation Needs (03/21/2023)   Received from [facility]    In the past 12 months, has lack of reliable transportation kept you from medical appointments, meetings, work or from getting things needed for daily living? : No  Physical Activity: Not on file  Stress: Not on file  Social Connections: Not on file  Intimate Partner Violence: Not on file  Depression  (PHQ2-9): Low Risk (11/20/2023)  Depression (PHQ2-9)    PHQ-2 Score: 0  Alcohol Screen: Not on file  Housing: Unknown (03/12/2024)   Received from [facility]   Epic    Unable to Pay for Housing in the Last Year: Not on file    Number of Times Moved in the Last Year: Not on file    At any time in the past 12 months, were you homeless or living in a shelter (including now)?: No  Utilities: Low Risk (03/21/2023)   Received from [facility]   Utilities    In the past 12 months has the electric, gas, oil, or water company threatened to shut off services in your home? : No  Health Literacy: Not on file    Family History  Problem Relation Age of Onset   Prostate cancer Father 46       metastatic   Heart disease Father    Hyperlipidemia Brother    Thyroid  cancer Brother 48   Heart disease Mother    Heart disease Maternal Grandfather    Cancer Paternal Grandfather        unknown type   Heart disease Brother    Hyperlipidemia Brother    Heart disease Brother    Hyperlipidemia Brother    Hyperlipidemia Brother    Hyperlipidemia Brother    Alzheimer's disease Maternal Grandmother    Thyroid  cancer Paternal Grandmother        dx 36s   Lung cancer Paternal Aunt        smoker   Cancer Other        unknown type, dx late 34s, maternal great-aunt   Cancer Maternal Uncle        unknown type, dx 86s   Cancer Paternal Uncle        unknown type, dx >50   Cancer Paternal Uncle        unknown type, dx >50   Breast cancer Other 70       maternal second cousin     Allergies[2]    Patient's last menstrual period was Patient's last menstrual period was 03/17/2013.SABRA            Review of Systems Alls systems reviewed and are negative.     Physical Exam Constitutional:      Appearance: Normal appearance.  Genitourinary:     Vulva and urethral meatus normal.     No lesions in the vagina.     Right Labia: No rash, lesions or skin changes.    Left Labia: No  lesions, skin changes or rash.    No vaginal discharge or tenderness.     No vaginal prolapse present.    No vaginal atrophy present.     Right Adnexa: not tender, not palpable and no mass present.    Left Adnexa: not tender, not palpable and no mass present.    No cervical motion tenderness or discharge.     Uterus is not enlarged, tender or irregular.  Breasts:    Right: Normal.     Left: Normal.  HENT:     Head: Normocephalic.  Neck:     Thyroid : No thyroid  mass, thyromegaly or thyroid  tenderness.  Cardiovascular:     Rate and Rhythm: Normal rate and regular rhythm.     Heart sounds: Normal heart sounds, S1 normal and S2 normal.  Pulmonary:     Effort: Pulmonary effort is normal.     Breath sounds: Normal breath sounds and air entry.  Abdominal:  General: There is no distension.     Palpations: Abdomen is soft. There is no mass.     Tenderness: There is no abdominal tenderness. There is no guarding or rebound.  Musculoskeletal:        General: Normal range of motion.     Cervical back: Full passive range of motion without pain, normal range of motion and neck supple. No tenderness.     Right lower leg: No edema.     Left lower leg: No edema.  Neurological:     Mental Status: She is alert.  Skin:    General: Skin is warm.  Psychiatric:        Mood and Affect: Mood normal.        Behavior: Behavior normal.        Thought Content: Thought content normal.  Vitals and nursing note reviewed. Exam conducted with a chaperone present.      Related Results   US  PELVIS TRANSVAGINAL NON-OB (TV ONLY) Final result 02/13/2024 11:33 AM        4.01cm uterus Endometrial lining is normal at 3.106mm Atrophic ovaries A 6.3 x 3.53mm Simple myometrial cyst noted anteriorly ?3.55mm submucosal fibroid noted  No adnexal masses No free fluid    A:         Well Woman GYN exam Submucosal fibroid/myometrial cyst Atrophic vaginitis HSV2 Ascus pap smear                              P:        Pap smear collected today Encouraged annual mammogram screening Colon cancer screening up-to-date DXA up-to-date Labs and immunizations to do with PMD Discussed breast self exams Encouraged healthy lifestyle practices Encouraged Vit D and Calcium  Refills given on valtrex  and intrarosa  Patient desires repeat PUS to evaluate any changes with myometrial cyst and fibroid No follow-ups on file.  Almarie MARLA Carpen     [1]  Current Outpatient Medications on File Prior to Visit  Medication Sig Dispense Refill   albuterol  (PROVENTIL  HFA;VENTOLIN  HFA) 108 (90 BASE) MCG/ACT inhaler Inhale 2 puffs into the lungs every 4 (four) hours as needed for wheezing or shortness of breath.      Ascorbic Acid (VITAMIN C) 500 MG CAPS See admin instructions.     Cholecalciferol ([employer] D3) 50 MCG (2000 UT) CAPS Take 1 capsule by mouth.     Clindamycin -Benzoyl Per, Refr, gel   6   clonazePAM (KLONOPIN) 1 MG tablet Take 1 mg by mouth daily.     ipratropium (ATROVENT) 0.06 % nasal spray Place 2 sprays into both nostrils 2 (two) times daily.     metoprolol  succinate (TOPROL -XL) 25 MG 24 hr tablet TAKE 1 TABLET (25 MG TOTAL) BY MOUTH DAILY. 90 tablet 1   metoprolol  tartrate (LOPRESSOR ) 25 MG tablet Take 0.5 tablets (12.5 mg total) by mouth daily as needed (for breakthrough palpitations). 45 tablet 3   Prasterone  (INTRAROSA ) 6.5 MG INST Place 1 suppository vaginally at bedtime as needed. 30 each 12   QVAR 80 MCG/ACT inhaler Inhale 2 puffs into the lungs as needed.   5   rosuvastatin (CRESTOR) 10 MG tablet Take 5 mg by mouth daily.     Semaglutide-Weight Management 1 MG/0.5ML SOAJ Inject into the skin.     sertraline (ZOLOFT) 50 MG tablet take 1/2 daily  1   tretinoin  (RETIN-A ) 0.05 % cream Apply topically at bedtime. 45 g 9  triamcinolone  cream (KENALOG ) 0.1 % Apply 1 application. topically as needed. Reported on 11/17/2015 80 g 8   valACYclovir  (VALTREX ) 500 MG tablet Take one tablet po bid x 3 days  prn 90 tablet 0   No current facility-administered medications on file prior to visit.  [2]  Allergies Allergen Reactions   Erythromycin Anaphylaxis and Other (See Comments)   Other     Other Reaction(s): Other (See Comments)  VICRYL-suture abscess   "

## 2024-11-26 ENCOUNTER — Other Ambulatory Visit: Payer: Self-pay | Admitting: Obstetrics and Gynecology

## 2024-11-26 ENCOUNTER — Ambulatory Visit: Admitting: Obstetrics and Gynecology

## 2024-11-26 ENCOUNTER — Other Ambulatory Visit

## 2024-11-26 VITALS — BP 124/78 | HR 67

## 2024-11-26 DIAGNOSIS — D219 Benign neoplasm of connective and other soft tissue, unspecified: Secondary | ICD-10-CM | POA: Diagnosis not present

## 2024-11-26 DIAGNOSIS — Z712 Person consulting for explanation of examination or test findings: Secondary | ICD-10-CM

## 2024-11-26 DIAGNOSIS — N859 Noninflammatory disorder of uterus, unspecified: Secondary | ICD-10-CM

## 2024-11-26 DIAGNOSIS — Z01419 Encounter for gynecological examination (general) (routine) without abnormal findings: Secondary | ICD-10-CM

## 2024-11-26 MED ORDER — MISOPROSTOL 200 MCG PO TABS: 200.0000 ug | ORAL_TABLET | Freq: Four times a day (QID) | ORAL | 0 refills | Status: AC

## 2024-11-26 MED ORDER — IBUPROFEN 800 MG PO TABS: 800.0000 mg | ORAL_TABLET | Freq: Three times a day (TID) | ORAL | 1 refills | Status: AC | PRN

## 2024-11-26 NOTE — Progress Notes (Signed)
" ° °  Acute Office Visit  Subjective:    Patient ID: Vickie Perry, female    DOB: 1961-09-17, 64 y.o.   MRN: [id number]   HPI 64 y.o. presents today for Office Visit (U/S Consult ) . Patient only with postcoital spotting. Here today for PUS results Patient's last menstrual period was 03/17/2013.    Review of Systems     Objective:    OBGyn Exam  BP 124/78 (BP Location: Left Arm, Patient Position: Sitting)   Pulse 67   LMP 03/17/2013   SpO2 98%  Wt Readings from Last 3 Encounters:  02/04/24 185 lb (83.9 kg)  01/06/24 185 lb (83.9 kg)  12/05/23 189 lb (85.7 kg)       PUS results today 5.15cm uterus Normal size and shape 2 simple myometrial cysts noted Previous US  with possible submucosal fibroid is better visualized today and appears to be a free fluid in the canal [employer]ascular No adnexal masses No free fluid Endometrial lining 1.56mm  Assessment & Plan:  Free fluid seen in endometrium. Discussed small area but to be sure those cells are not cancerous or precancerous, we should get an endometrial biopsy to sample the fluid. Discussed the procedure in detail. To place cytotec  the night before the procedure and take motrin  at least one hour before the biopsy.  She agreed.  30 minutes spent on reviewing records, imaging,  and one on one patient time and counseling patient and documentation Dr. Glennon Almarie Vickie Perry  "

## 2024-11-27 ENCOUNTER — Other Ambulatory Visit: Payer: Self-pay | Admitting: Obstetrics and Gynecology

## 2024-11-27 ENCOUNTER — Telehealth: Payer: Self-pay

## 2024-11-27 MED ORDER — MISOPROSTOL 200 MCG PO TABS: ORAL_TABLET | ORAL | 0 refills | Status: AC

## 2024-11-27 NOTE — Telephone Encounter (Signed)
 Med refill request:   misoprostol  (CYTOTEC ) 200 MCG tablet   Disp:  1 tablets Refills:  0  Last AEX:  11/25/24 Next AEX:  Not yet scheduled Last MMG (if hormonal med):  N/A Refill authorized? Please Advise.

## 2024-11-27 NOTE — Telephone Encounter (Signed)
 11/26/24 OV notes reviewed.   Dr. Glennon -please clarify Cytotec  instructions.

## 2024-11-27 NOTE — Telephone Encounter (Signed)
 Patient called and said Dr Glennon sent in rx for cytotec  for her but when she went to the pharmacy to get it, they told her it was written to take by mouth 4 times a day instead of inserting into vagina. I discontinued the rx and sent in cytotec  200mg  insert 2 into the vagina the night before the procedure. Patient is aware.

## 2024-11-27 NOTE — Telephone Encounter (Signed)
 Med refill request: misoprostol  (Cyotec) 200 mcg tablet Last AEX: 11/25/24 EB Next AEX: 12/01/25 EB Last MMG (if hormonal med) 10/05/24 Refill authorized: Please Advise?  Last Rx sent #1 tablet with zero refills on yesterday 11/26/24 EB

## 2024-12-02 ENCOUNTER — Ambulatory Visit: Payer: Self-pay | Admitting: Obstetrics and Gynecology

## 2024-12-02 ENCOUNTER — Other Ambulatory Visit: Payer: Self-pay | Admitting: Obstetrics and Gynecology

## 2024-12-02 LAB — CYTOLOGY - PAP: Diagnosis: NEGATIVE

## 2024-12-02 MED ORDER — INTRAROSA 6.5 MG VA INST: 1.0000 | VAGINAL_INSERT | Freq: Every evening | VAGINAL | 12 refills | Status: AC | PRN

## 2024-12-16 ENCOUNTER — Ambulatory Visit: Admitting: Obstetrics and Gynecology

## 2025-12-01 ENCOUNTER — Ambulatory Visit: Admitting: Obstetrics and Gynecology
